# Patient Record
Sex: Male | Born: 1980 | Race: White | Hispanic: No | Marital: Married | Smoking: Current every day smoker
Health system: Southern US, Community
[De-identification: ages and names within clinical notes are randomized; demographics above are authoritative.]

## PROBLEM LIST (undated history)

## (undated) DIAGNOSIS — K429 Umbilical hernia without obstruction or gangrene: Secondary | ICD-10-CM

## (undated) DIAGNOSIS — I739 Peripheral vascular disease, unspecified: Secondary | ICD-10-CM

## (undated) HISTORY — DX: Peripheral vascular disease, unspecified: I73.9

---

## 2004-12-19 ENCOUNTER — Emergency Department (HOSPITAL_COMMUNITY): Admission: EM | Admit: 2004-12-19 | Discharge: 2004-12-19 | Payer: Self-pay | Admitting: *Deleted

## 2009-11-29 ENCOUNTER — Emergency Department: Payer: Self-pay | Admitting: Emergency Medicine

## 2010-06-02 IMAGING — CR LEFT RING FINGER 2+V
1 series · 3 of 3 positions shown · non-contrast
Comparison: none

REASON FOR EXAM: injury 3 months ago
COMMENTS:   May transport without cardiac monitor

PROCEDURE:     DXR - DXR FINGER RING 4TH DIGIT LT AZAM  - November 29, 2009 [DATE]
RESULT:     Three views were obtained. No fracture, dislocation or other
acute bony abnormality is identified.

[Series 1: view not recorded · 0.17mm/px · 3 of 3 slices shown]
[im 1/3]
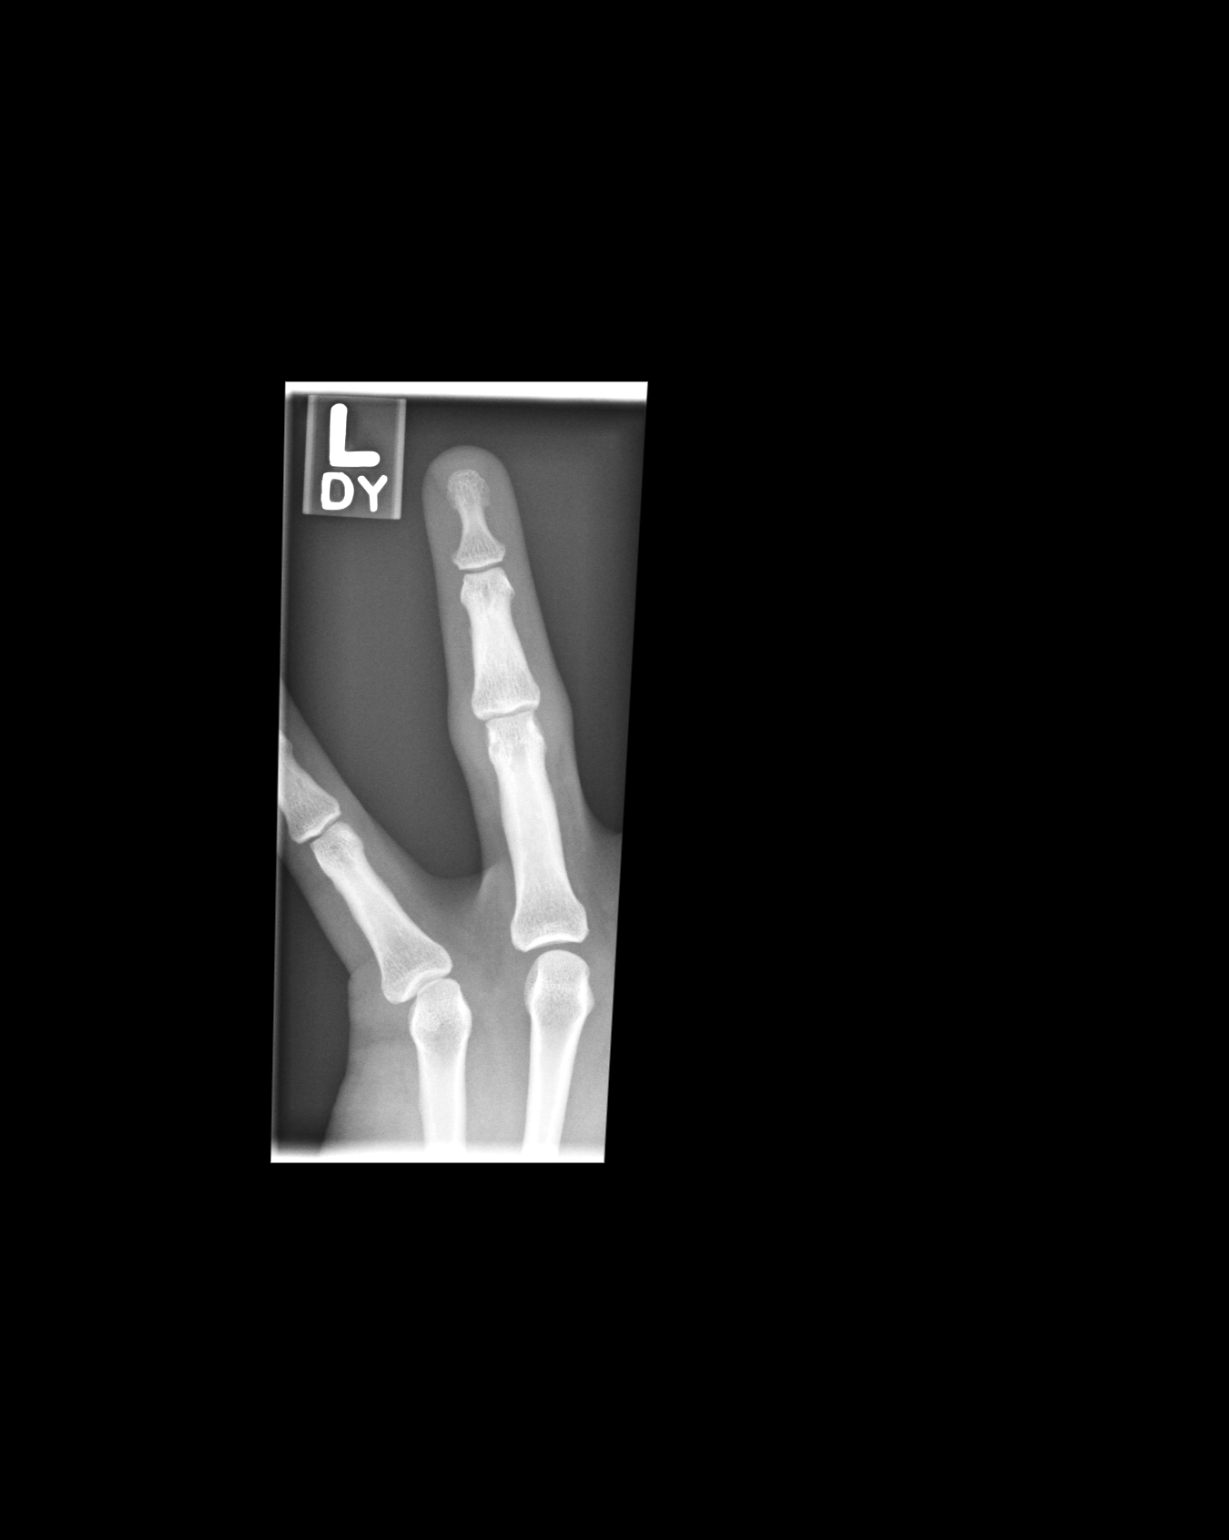
[im 2/3]
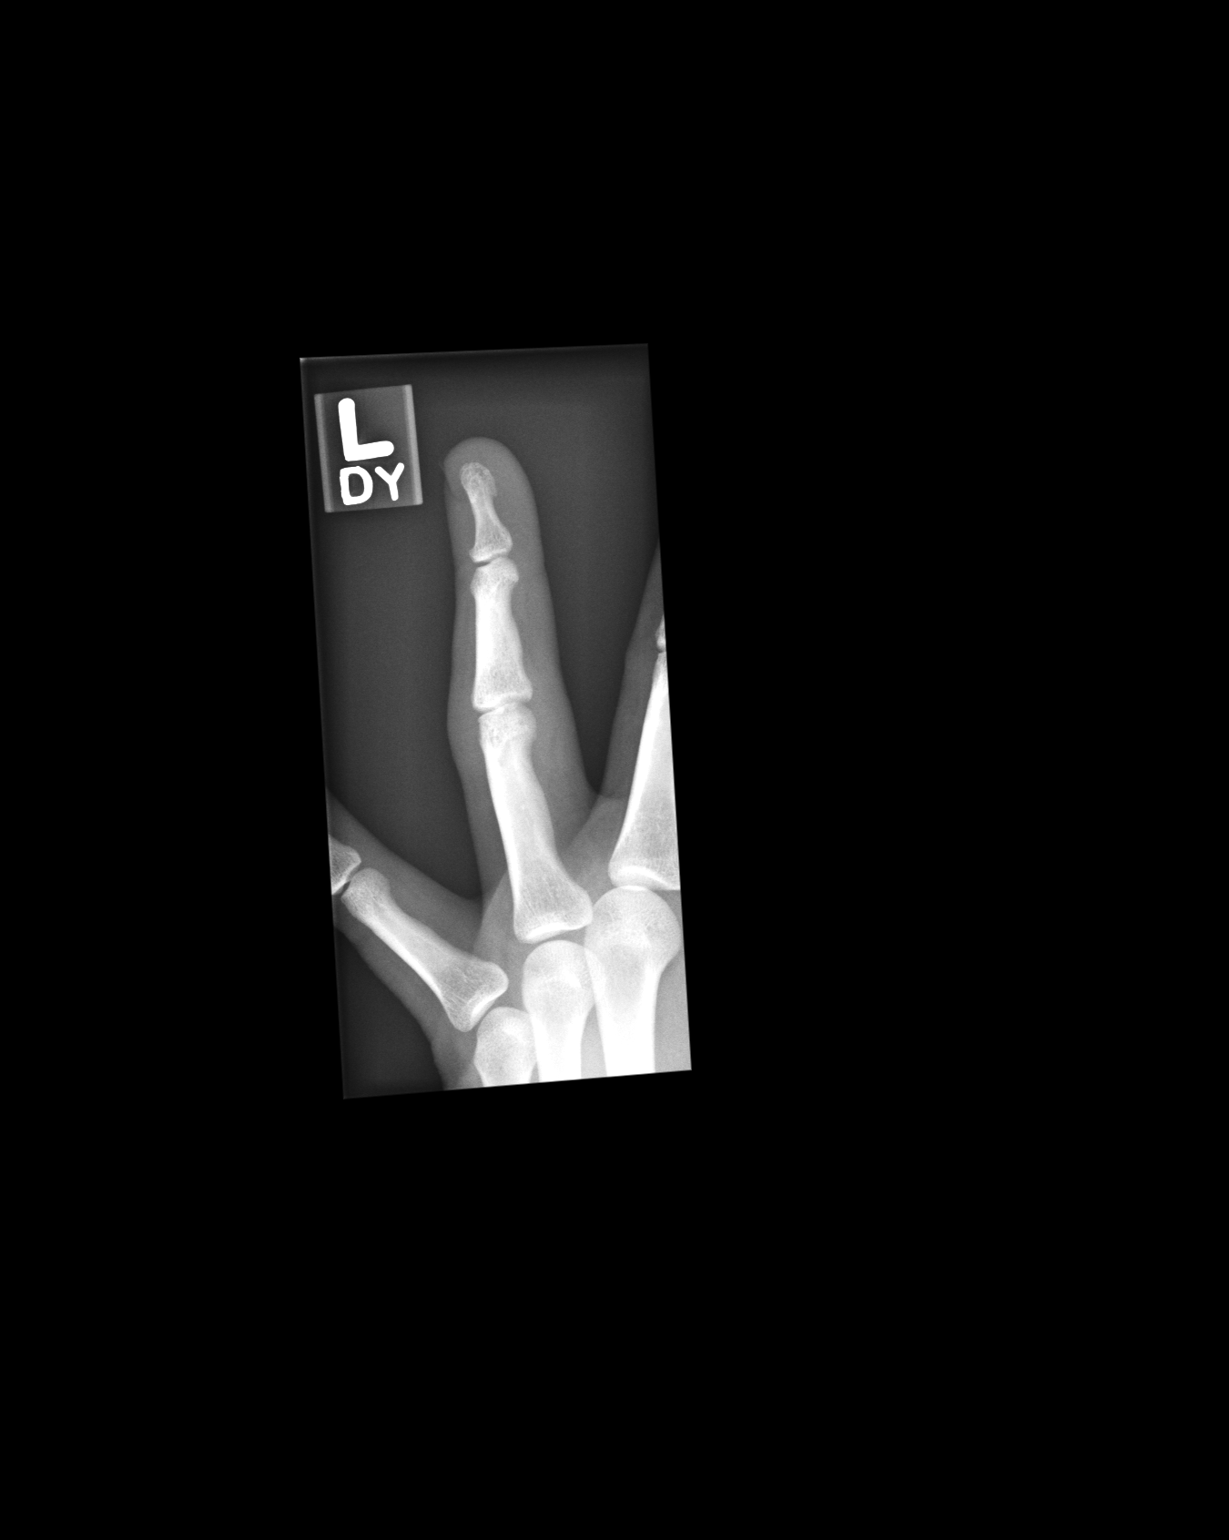
[im 3/3]
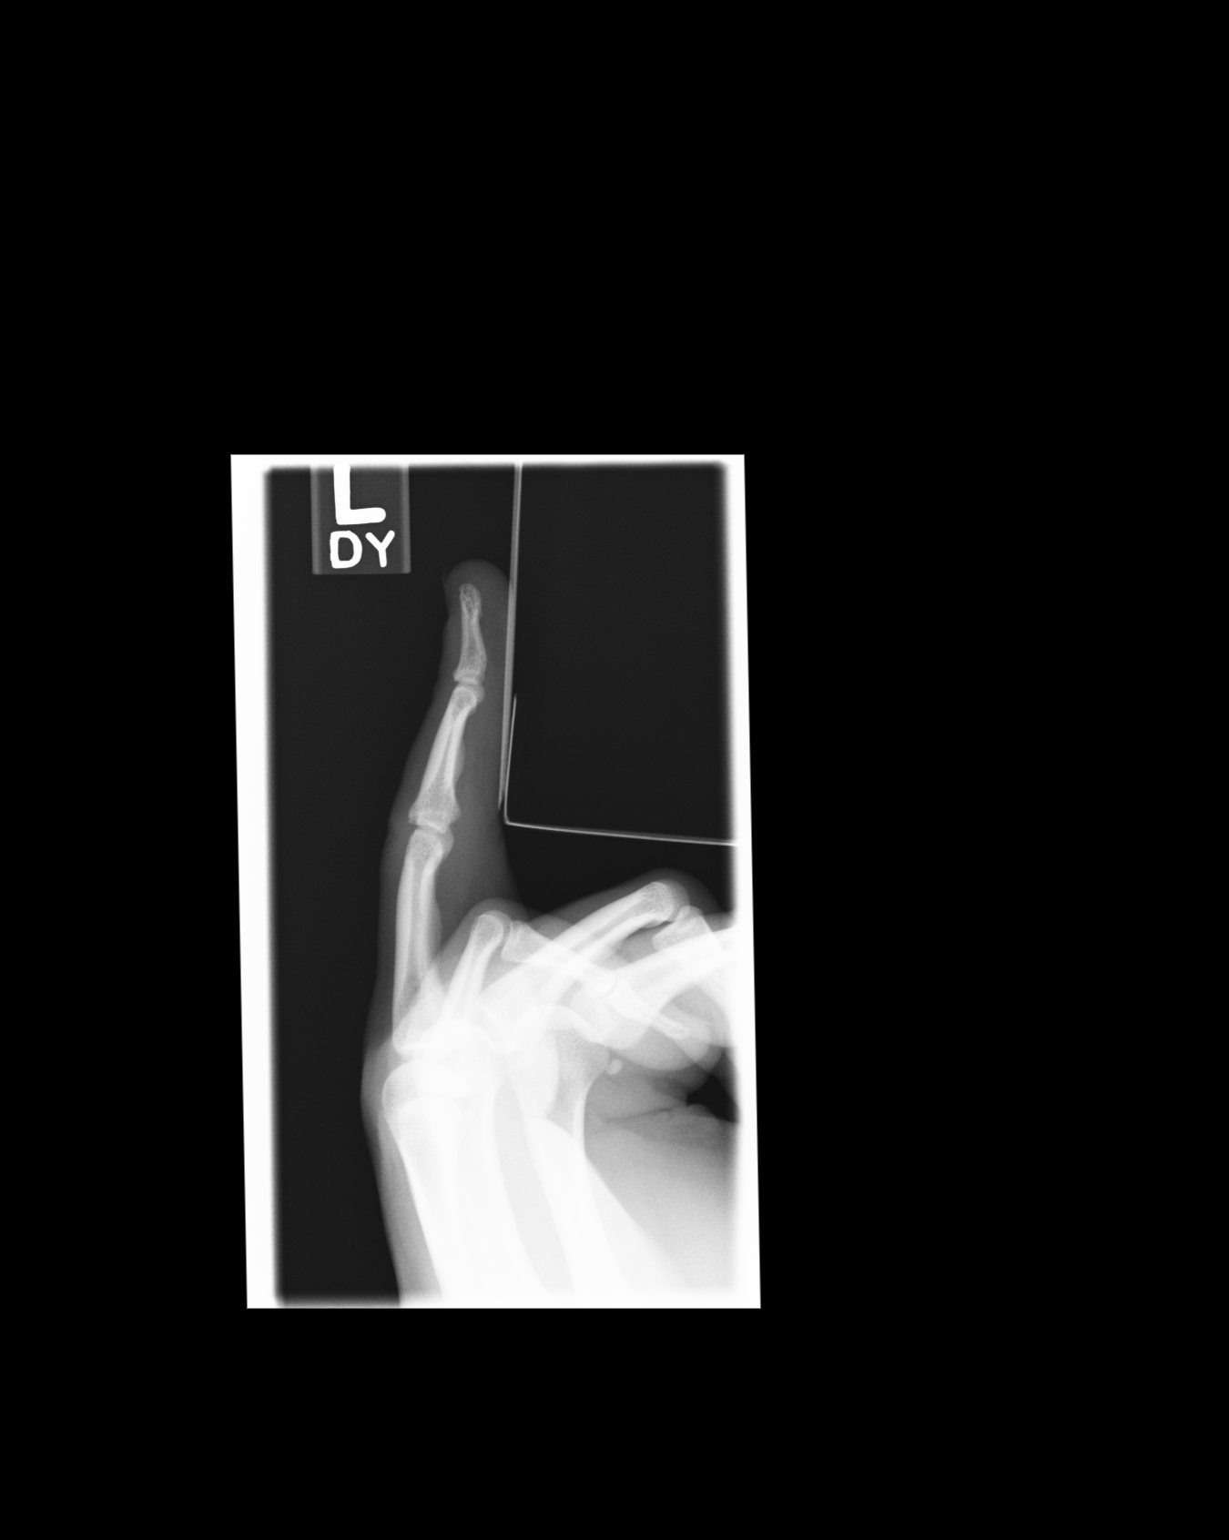

[3 of 3 positions shown; findings below may reference images not displayed]

IMPRESSION: 1. No significant osseous abnormalities are noted.
2. No radiodense soft tissue foreign body is seen.

## 2010-09-12 ENCOUNTER — Ambulatory Visit: Payer: Self-pay

## 2011-03-16 IMAGING — CR DG FOOT COMPLETE 3+V*L*
1 series · 3 of 3 positions shown · non-contrast
Comparison: none

REASON FOR EXAM: left foot pain from fall Please Fax result [phone]
COMMENTS:

PROCEDURE:     DXR - DXR FOOT LT COMP W/OBLIQUES  - September 12, 2010  [DATE]
RESULT:     Images of the left foot demonstrate no evidence of fracture,
dislocation or radiopaque foreign body.

[Series 1: view not recorded · 0.17mm/px · 3 of 3 slices shown]
[im 1/3]
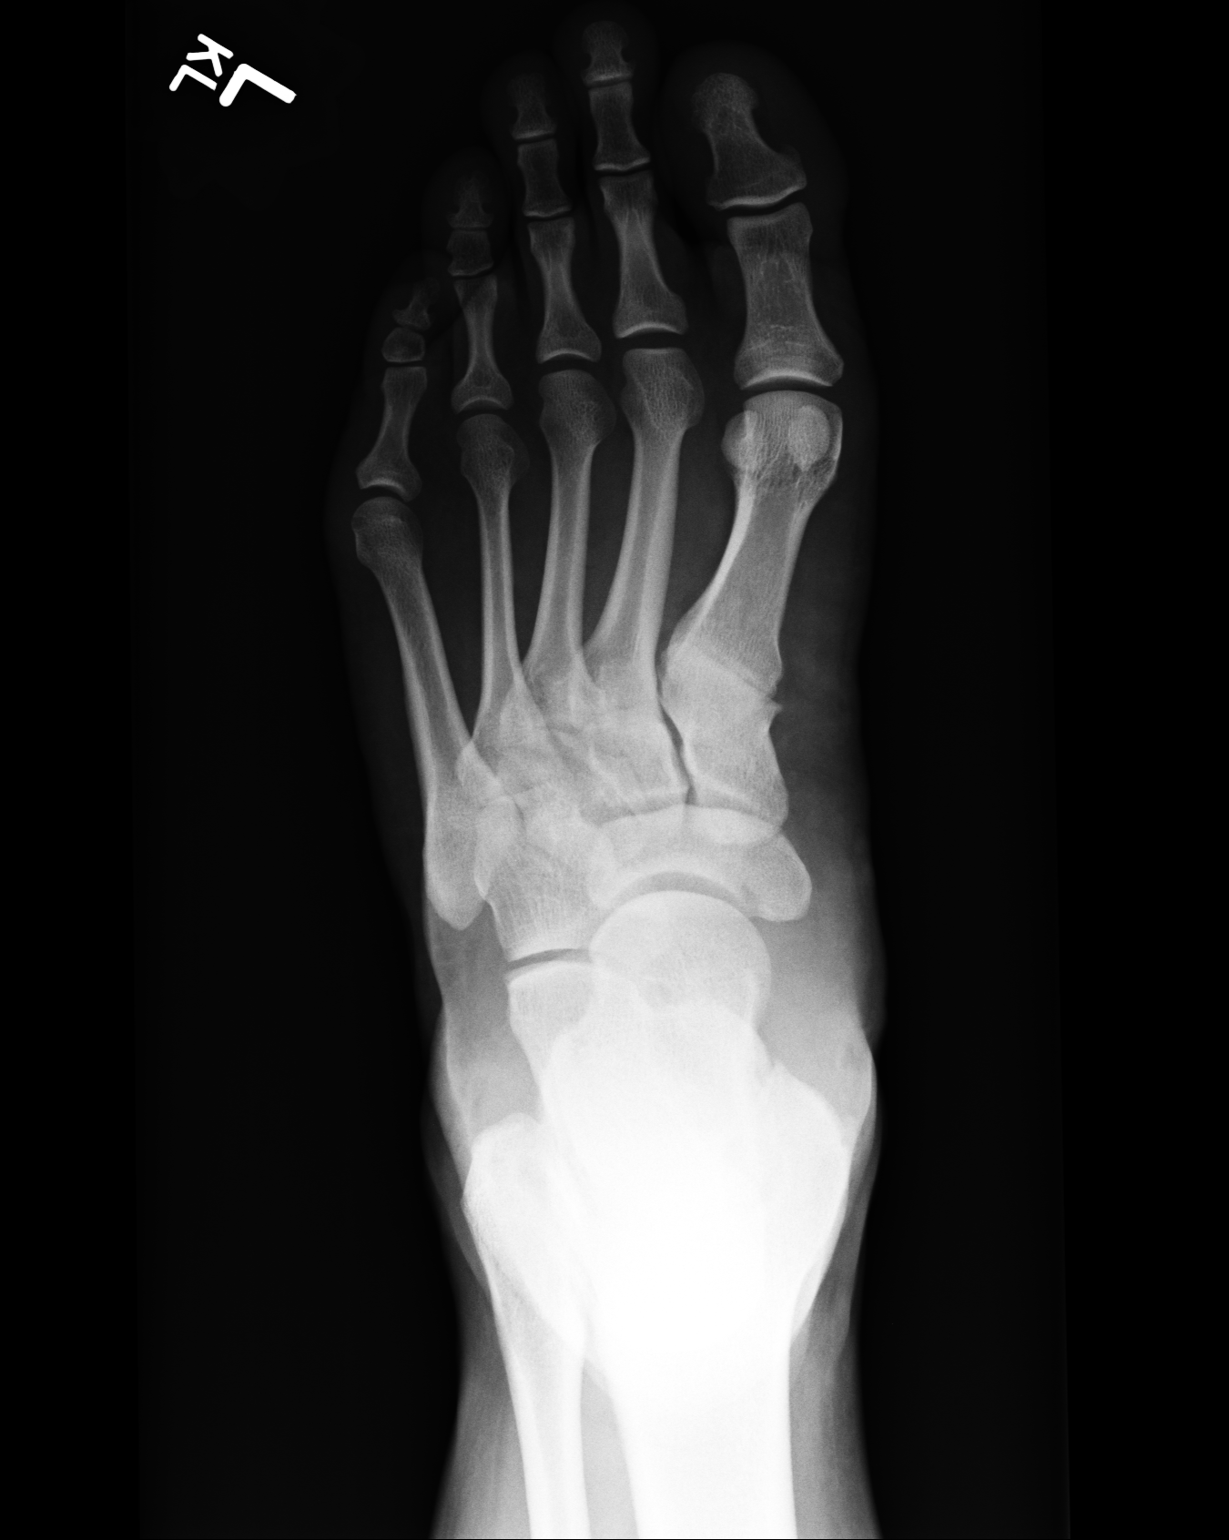
[im 2/3]
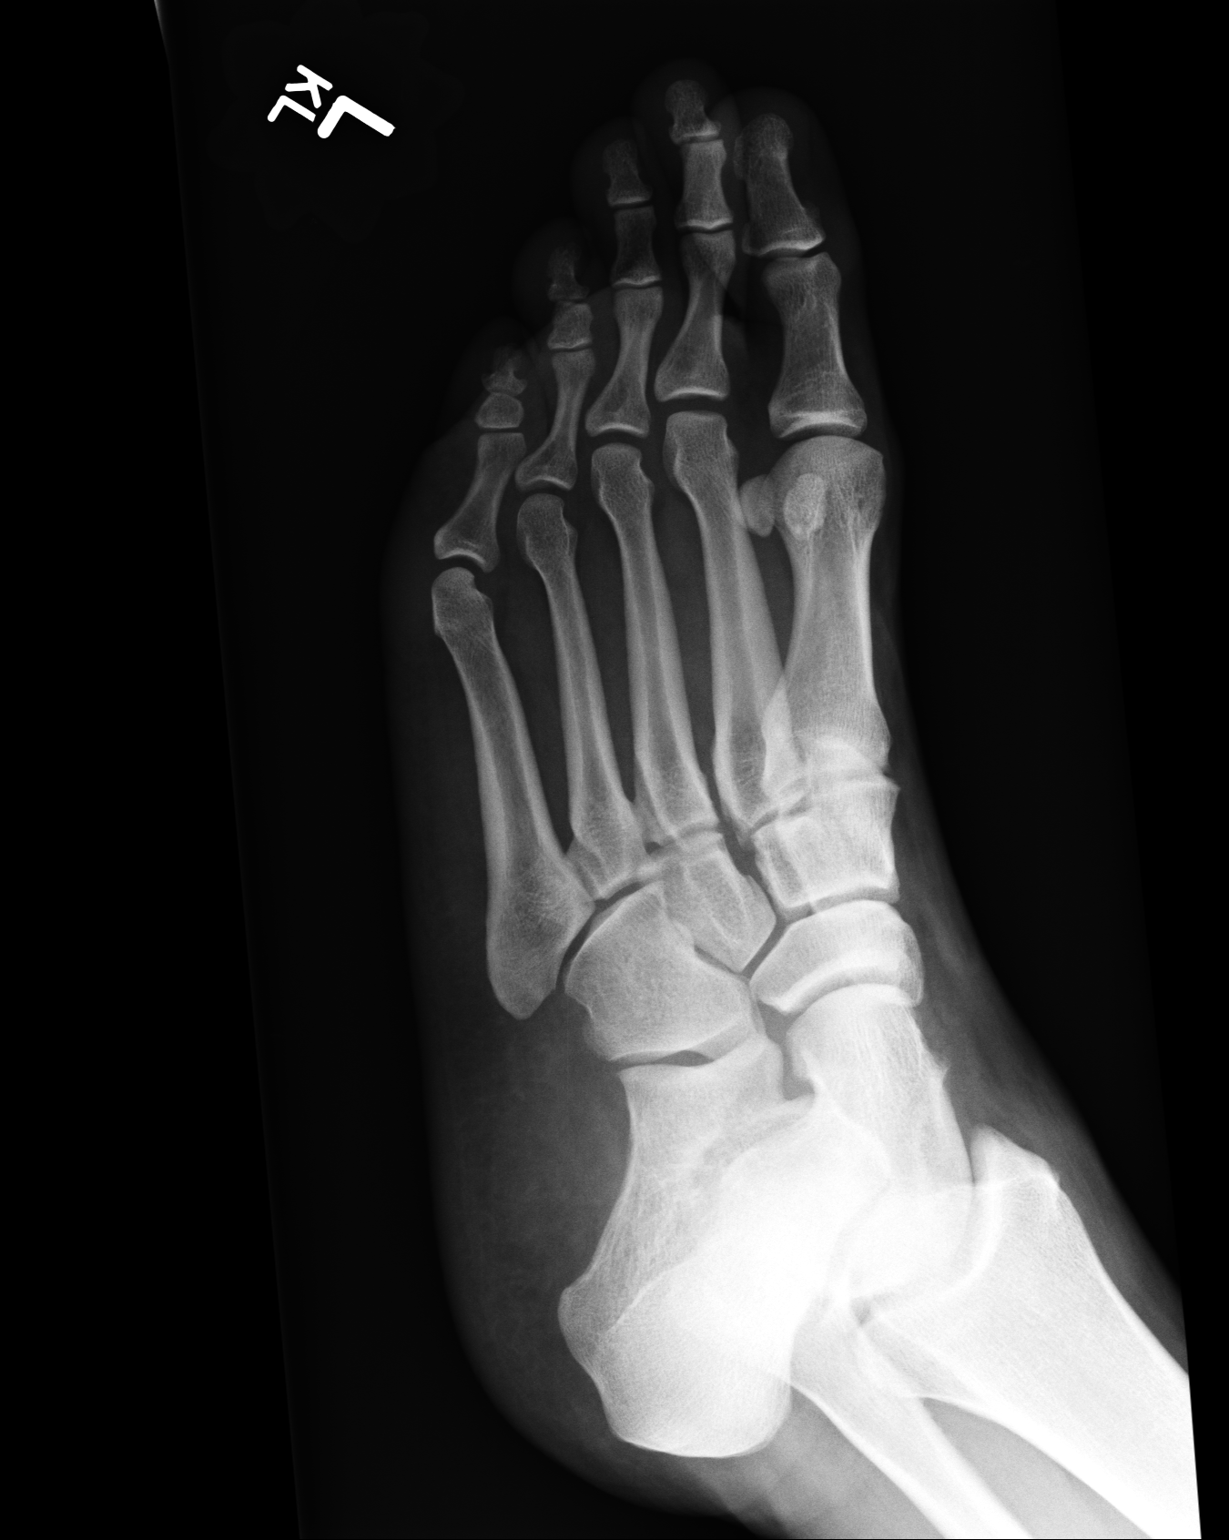
[im 3/3]
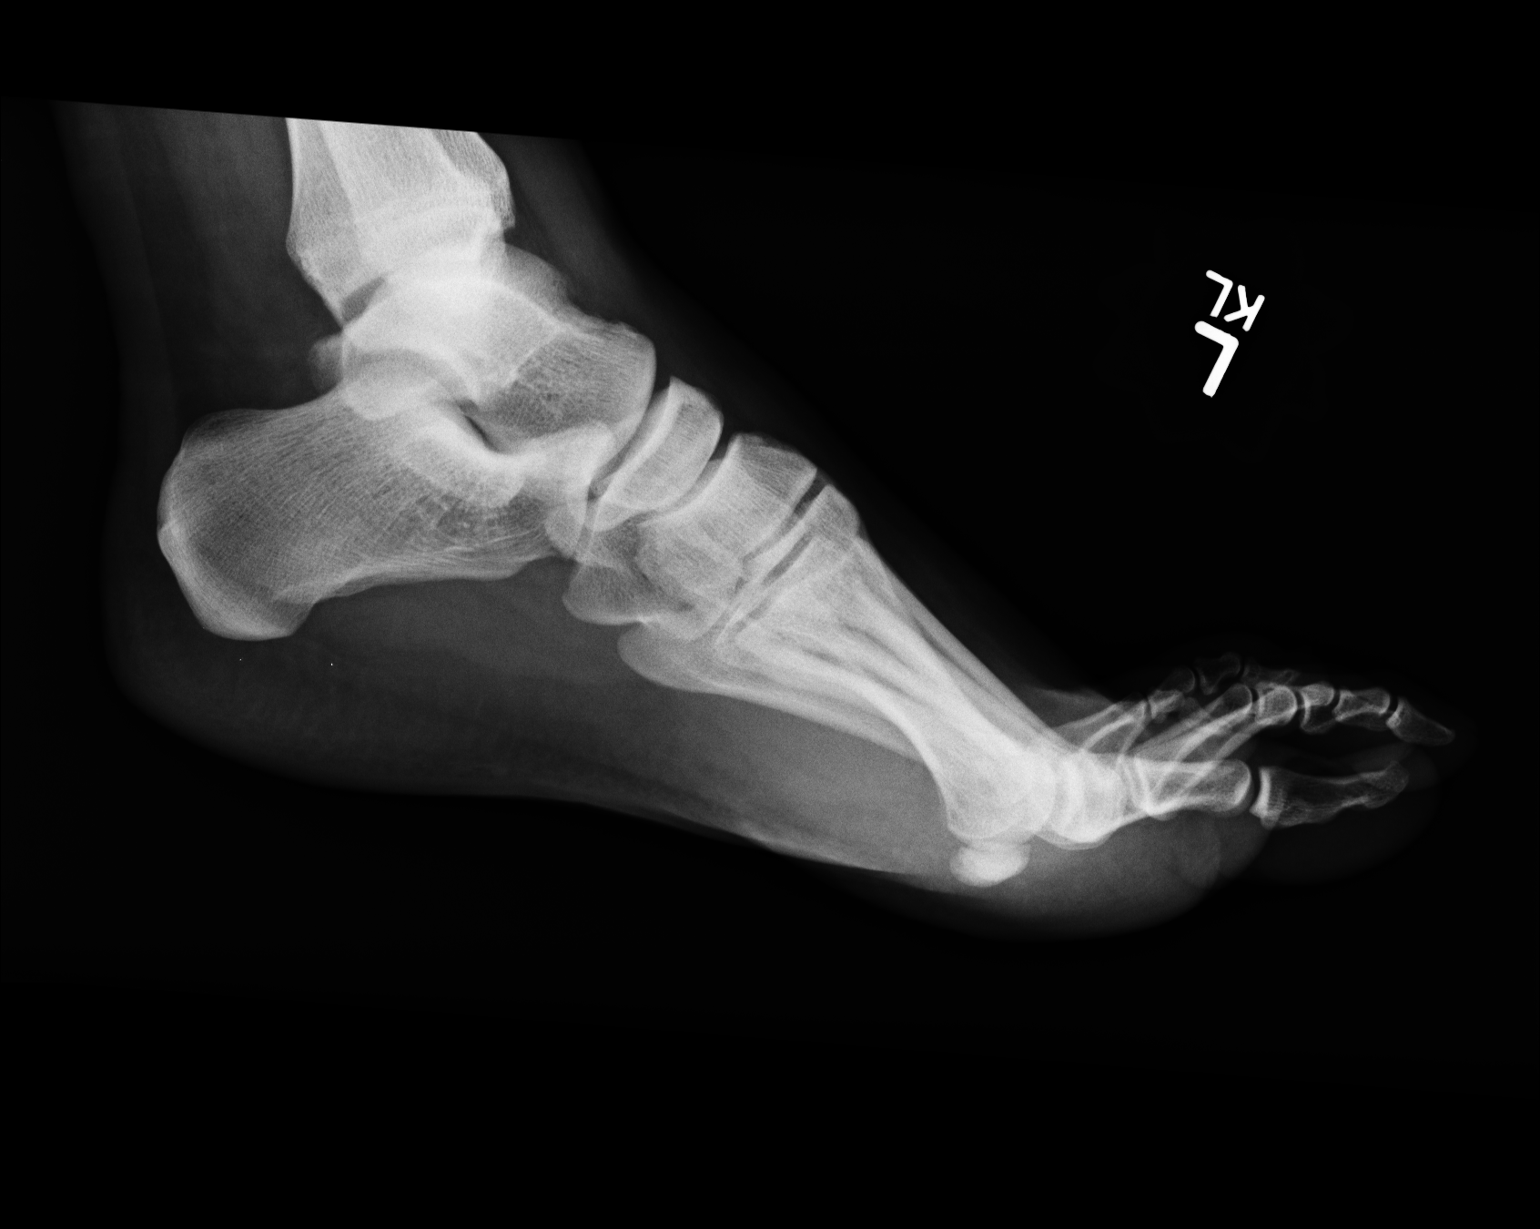

[3 of 3 positions shown; findings below may reference images not displayed]

IMPRESSION: No acute bony abnormality evident.

## 2022-05-01 ENCOUNTER — Emergency Department (HOSPITAL_COMMUNITY): Payer: Medicaid Other

## 2022-05-01 ENCOUNTER — Emergency Department (HOSPITAL_COMMUNITY)
Admission: EM | Admit: 2022-05-01 | Discharge: 2022-05-02 | Disposition: A | Discharge status: Home / Self Care | Payer: Medicaid Other | Attending: Emergency Medicine | Admitting: Emergency Medicine

## 2022-05-01 ENCOUNTER — Encounter (HOSPITAL_COMMUNITY): Payer: Self-pay | Admitting: Emergency Medicine

## 2022-05-01 DIAGNOSIS — T50901A Poisoning by unspecified drugs, medicaments and biological substances, accidental (unintentional), initial encounter: Secondary | ICD-10-CM

## 2022-05-01 DIAGNOSIS — D72829 Elevated white blood cell count, unspecified: Secondary | ICD-10-CM | POA: Diagnosis not present

## 2022-05-01 DIAGNOSIS — T424X1A Poisoning by benzodiazepines, accidental (unintentional), initial encounter: Secondary | ICD-10-CM | POA: Insufficient documentation

## 2022-05-01 DIAGNOSIS — T405X1A Poisoning by cocaine, accidental (unintentional), initial encounter: Secondary | ICD-10-CM | POA: Diagnosis not present

## 2022-05-01 DIAGNOSIS — R21 Rash and other nonspecific skin eruption: Secondary | ICD-10-CM | POA: Insufficient documentation

## 2022-05-01 DIAGNOSIS — R4182 Altered mental status, unspecified: Secondary | ICD-10-CM | POA: Diagnosis not present

## 2022-05-01 LAB — CBC WITH DIFFERENTIAL/PLATELET
Abs Immature Granulocytes: 0.19 10*3/uL — ABNORMAL HIGH (ref 0.00–0.07)
Basophils Absolute: 0.1 10*3/uL (ref 0.0–0.1)
Basophils Relative: 1 %
Eosinophils Absolute: 0.6 10*3/uL — ABNORMAL HIGH (ref 0.0–0.5)
Eosinophils Relative: 5 %
HCT: 38.7 % — ABNORMAL LOW (ref 39.0–52.0)
Hemoglobin: 12 g/dL — ABNORMAL LOW (ref 13.0–17.0)
Immature Granulocytes: 2 %
Lymphocytes Relative: 20 %
Lymphs Abs: 2.5 10*3/uL (ref 0.7–4.0)
MCH: 26 pg (ref 26.0–34.0)
MCHC: 31 g/dL (ref 30.0–36.0)
MCV: 83.9 fL (ref 80.0–100.0)
Monocytes Absolute: 1.1 10*3/uL — ABNORMAL HIGH (ref 0.1–1.0)
Monocytes Relative: 9 %
Neutro Abs: 7.6 10*3/uL (ref 1.7–7.7)
Neutrophils Relative %: 63 %
Platelets: 300 10*3/uL (ref 150–400)
RBC: 4.61 MIL/uL (ref 4.22–5.81)
RDW: 14.4 % (ref 11.5–15.5)
WBC: 12.1 10*3/uL — ABNORMAL HIGH (ref 4.0–10.5)
nRBC: 0 % (ref 0.0–0.2)

## 2022-05-01 LAB — URINALYSIS, ROUTINE W REFLEX MICROSCOPIC
Bilirubin Urine: NEGATIVE
Glucose, UA: NEGATIVE mg/dL
Hgb urine dipstick: NEGATIVE
Ketones, ur: NEGATIVE mg/dL
Leukocytes,Ua: NEGATIVE
Nitrite: NEGATIVE
Protein, ur: NEGATIVE mg/dL
Specific Gravity, Urine: 1.01 (ref 1.005–1.030)
pH: 6 (ref 5.0–8.0)

## 2022-05-01 LAB — COMPREHENSIVE METABOLIC PANEL
ALT: 11 U/L (ref 0–44)
AST: 16 U/L (ref 15–41)
Albumin: 3.6 g/dL (ref 3.5–5.0)
Alkaline Phosphatase: 77 U/L (ref 38–126)
Anion gap: 8 (ref 5–15)
BUN: 17 mg/dL (ref 6–20)
CO2: 29 mmol/L (ref 22–32)
Calcium: 8.5 mg/dL — ABNORMAL LOW (ref 8.9–10.3)
Chloride: 103 mmol/L (ref 98–111)
Creatinine, Ser: 0.8 mg/dL (ref 0.61–1.24)
GFR, Estimated: >60 mL/min (ref >60)
Glucose, Bld: 97 mg/dL (ref 70–99)
Potassium: 4.1 mmol/L (ref 3.5–5.1)
Sodium: 140 mmol/L (ref 135–145)
Total Bilirubin: 0.4 mg/dL (ref 0.3–1.2)
Total Protein: 6.8 g/dL (ref 6.5–8.1)

## 2022-05-01 LAB — RAPID URINE DRUG SCREEN, HOSP PERFORMED
Amphetamines: NOT DETECTED
Barbiturates: NOT DETECTED
Benzodiazepines: POSITIVE — AB
Cocaine: POSITIVE — AB
Opiates: NOT DETECTED
Tetrahydrocannabinol: NOT DETECTED

## 2022-05-01 LAB — ETHANOL: Alcohol, Ethyl (B): <10 mg/dL (ref <10)

## 2022-05-01 MED ORDER — IOHEXOL 300 MG/ML  SOLN
75.0000 mL | Freq: Once | INTRAMUSCULAR | Status: AC | PRN
  Administered 2022-05-01: 75 mL via INTRAVENOUS

## 2022-05-01 MED ORDER — SODIUM CHLORIDE 0.9 % IV BOLUS
1000.0000 mL | Freq: Once | INTRAVENOUS | Status: AC
  Administered 2022-05-01: 1000 mL via INTRAVENOUS

## 2022-05-01 MED ORDER — ALBUTEROL SULFATE (2.5 MG/3ML) 0.083% IN NEBU
2.5000 mg | INHALATION_SOLUTION | Freq: Once | RESPIRATORY_TRACT | Status: AC
  Administered 2022-05-02: 2.5 mg via RESPIRATORY_TRACT
  Filled 2022-05-01: qty 3

## 2022-05-01 MED ORDER — ONDANSETRON HCL 4 MG/2ML IJ SOLN
4.0000 mg | Freq: Once | INTRAMUSCULAR | Status: AC
  Administered 2022-05-01: 4 mg via INTRAVENOUS
  Filled 2022-05-01: qty 2

## 2022-05-01 MED ORDER — NALOXONE HCL 2 MG/2ML IJ SOSY
2.0000 mg | PREFILLED_SYRINGE | INTRAMUSCULAR | Status: DC | PRN
  Administered 2022-05-01: 2 mg via INTRAVENOUS
  Filled 2022-05-01: qty 2

## 2022-05-01 MED ORDER — METHYLPREDNISOLONE SODIUM SUCC 125 MG IJ SOLR
125.0000 mg | Freq: Once | INTRAMUSCULAR | Status: AC
  Administered 2022-05-01: 125 mg via INTRAVENOUS
  Filled 2022-05-01: qty 2

## 2022-05-01 MED ORDER — IPRATROPIUM-ALBUTEROL 0.5-2.5 (3) MG/3ML IN SOLN
3.0000 mL | Freq: Once | RESPIRATORY_TRACT | Status: AC
  Administered 2022-05-02: 3 mL via RESPIRATORY_TRACT
  Filled 2022-05-01: qty 3

## 2022-05-01 NOTE — ED Provider Notes (Signed)
11:32 PM Assumed care from Dr. Estell Harpin, please see their note for full history, physical and decision making until this point. In brief this is a 41 y.o. year old male who presented to the ED tonight with Drug Overdose     Here with OD but slightly hypoxic with wheezing and new mild hypoxia. CXR shows large right sided opacity, pending repeat CT/nebs and reeval for disposition.   Patient observed for multiple hours and every once while would become hypoxic but only when he went to sleep.  He would become normoxic on his own without any intervention.  He was put on oxygen and just to keep his numbers looking good.  When he was awake he never dropped below 92 during a prolonged conversation.  I discussed this with his wife as well and it sounds he does have symptoms consistent with likely sleep apnea.  He wakes up multiple times at night gasping for air.  He has persistent hypertension.  Does not have a PCP so I will refer him to pulmonology for sleep study.  Prescription for Narcan given.  He is protecting his airway he has respiratory drive he is stable for discharge with his family.  Discharge instructions, including strict return precautions for new or worsening symptoms, given. Patient and/or family verbalized understanding and agreement with the plan as described.   Labs, studies and imaging reviewed by myself and considered in medical decision making if ordered. Imaging interpreted by radiology.  Labs Reviewed  CBC WITH DIFFERENTIAL/PLATELET - Abnormal; Notable for the following components:      Result Value   WBC 12.1 (*)    Hemoglobin 12.0 (*)    HCT 38.7 (*)    Monocytes Absolute 1.1 (*)    Eosinophils Absolute 0.6 (*)    Abs Immature Granulocytes 0.19 (*)    All other components within normal limits  COMPREHENSIVE METABOLIC PANEL - Abnormal; Notable for the following components:   Calcium 8.5 (*)    All other components within normal limits  RAPID URINE DRUG SCREEN, HOSP PERFORMED -  Abnormal; Notable for the following components:   Cocaine POSITIVE (*)    Benzodiazepines POSITIVE (*)    All other components within normal limits  URINALYSIS, ROUTINE W REFLEX MICROSCOPIC - Abnormal; Notable for the following components:   Color, Urine STRAW (*)    All other components within normal limits  ETHANOL    DG Chest Port 1 View  Final Result    CT Head Wo Contrast  Final Result    CT Chest W Contrast    (Results Pending)    No follow-ups on file.    Konica Stankowski, Barbara Cower, MD 05/02/22 [phone]

## 2022-05-01 NOTE — ED Notes (Signed)
Transported to CT 

## 2022-05-01 NOTE — ED Notes (Addendum)
Patient awake after narcan administration. Coughing and moving in the bed. Dr. Estell Harpin to bedside.

## 2022-05-01 NOTE — ED Notes (Addendum)
Pt arrived via EMS, per EMS pt spouse states pt has been lethargic since this morning. Smoked some marijuana but no other drugs. Given narcan by spouse with no effect. Pt wakes to tactile stimuli. On 2L [place].

## 2022-05-01 NOTE — ED Provider Notes (Signed)
[facility]-EMERGENCY DEPT Provider Note   CSN: [id number] Arrival date & time: 05/01/22  1700     History {Add pertinent medical, surgical, social history, OB history to HPI:1} Chief Complaint  Patient presents with   Drug Overdose    TOWNSEND CUDWORTH is a 41 y.o. male.  Patient with brought in for possible substance abuse and altered mental status.  His wife gave him some Narcan sporadically without help..  Patient has a history of ulcers on his lower legs.  The history is provided by a relative and the spouse. No language interpreter was used.  Drug Overdose This is a new problem. The current episode started less than 1 hour ago. The problem occurs rarely. Nothing aggravates the symptoms. He has tried nothing for the symptoms. The treatment provided no relief.       Home Medications Prior to Admission medications   Medication Sig Start Date End Date Taking? Authorizing Provider  acetaminophen (TYLENOL) 500 MG tablet Take 500-1,000 mg by mouth every 6 (six) hours as needed for mild pain or headache.   Yes [provider]      Allergies    Patient has no known allergies.    Review of Systems   Review of Systems  Unable to perform ROS: Mental status change    Physical Exam Updated Vital Signs BP (!) 175/95   Pulse 89   Temp 99.3 F (37.4 C)   Resp (!) 31   SpO2 95%  Physical Exam Vitals and nursing note reviewed.  Constitutional:      Appearance: He is well-developed.     Comments: Patient responding to painful stimuli only  HENT:     Head: Normocephalic.     Nose: Nose normal.  Eyes:     General: No scleral icterus.    Conjunctiva/sclera: Conjunctivae normal.     Comments: Pinpoint pupils  Neck:     Thyroid: No thyromegaly.  Cardiovascular:     Rate and Rhythm: Normal rate and regular rhythm.     Heart sounds: No murmur heard.    No friction rub. No gallop.  Pulmonary:     Breath sounds: No stridor. No rales.  Chest:      Chest wall: No tenderness.  Abdominal:     General: There is no distension.     Tenderness: There is no abdominal tenderness. There is no rebound.  Musculoskeletal:        General: Normal range of motion.     Cervical back: Neck supple.  Lymphadenopathy:     Cervical: No cervical adenopathy.  Skin:    Findings: Rash present. No erythema.     Comments: Ulcers on both lower legs  Neurological:     Mental Status: He is oriented to person, place, and time.     Motor: No abnormal muscle tone.     Coordination: Coordination normal.     Comments: Lethargic  Psychiatric:        Behavior: Behavior normal.     ED Results / Procedures / Treatments   Labs (all labs ordered are listed, but only abnormal results are displayed) Labs Reviewed  CBC WITH DIFFERENTIAL/PLATELET - Abnormal; Notable for the following components:      Result Value   WBC 12.1 (*)    Hemoglobin 12.0 (*)    HCT 38.7 (*)    Monocytes Absolute 1.1 (*)    Eosinophils Absolute 0.6 (*)    Abs Immature Granulocytes 0.19 (*)  All other components within normal limits  COMPREHENSIVE METABOLIC PANEL - Abnormal; Notable for the following components:   Calcium 8.5 (*)    All other components within normal limits  RAPID URINE DRUG SCREEN, HOSP PERFORMED - Abnormal; Notable for the following components:   Cocaine POSITIVE (*)    Benzodiazepines POSITIVE (*)    All other components within normal limits  URINALYSIS, ROUTINE W REFLEX MICROSCOPIC - Abnormal; Notable for the following components:   Color, Urine STRAW (*)    All other components within normal limits  ETHANOL    EKG EKG Interpretation  Date/Time:  Monday May 01 2022 17:31:24 EDT Ventricular Rate:  78 PR Interval:  144 QRS Duration: 89 QT Interval:  396 QTC Calculation: 452 R Axis:   25 Text Interpretation: Sinus rhythm Confirmed by Bethann Berkshire [phone]) on 05/01/2022 8:22:14 PM  Radiology DG Chest Port 1 View  Result Date: 05/01/2022 CLINICAL  DATA:  Shortness of breath EXAM: PORTABLE CHEST 1 VIEW COMPARISON:  None. FINDINGS: There is increased soft tissue density in the low right paratracheal region and right suprahilar region measuring 7.8 by 1.8 cm. The cardiac silhouette is within normal limits. The lungs are otherwise clear. No pleural effusion or pneumothorax. No acute fractures are seen. IMPRESSION: Abnormal soft tissue density in the right paratracheal and suprahilar region, indeterminate. Recommend further evaluation with chest CT. Electronically Signed   By: Darliss Cheney M.D.   On: 05/01/2022 23:26   CT Head Wo Contrast  Result Date: 05/01/2022 CLINICAL DATA:  Mental status change. EXAM: CT HEAD WITHOUT CONTRAST TECHNIQUE: Contiguous axial images were obtained from the base of the skull through the vertex without intravenous contrast. RADIATION DOSE REDUCTION: This exam was performed according to the departmental dose-optimization program which includes automated exposure control, adjustment of the mA and/or kV according to patient size and/or use of iterative reconstruction technique. COMPARISON:  None Available. FINDINGS: Brain: No evidence of acute infarction, hemorrhage, hydrocephalus, extra-axial collection or mass lesion/mass effect. Vascular: No hyperdense vessel or unexpected calcification. Skull: Normal. Negative for fracture or focal lesion. Sinuses/Orbits: Mucosal thickening in scattered ethmoid air cells and the maxillary sinuses. Other: None. IMPRESSION: No acute intracranial abnormalities identified. Electronically Signed   By: Gerome Sam III M.D.   On: 05/01/2022 18:55    Procedures Procedures  {Document cardiac monitor, telemetry assessment procedure when appropriate:1}  Medications Ordered in ED Medications  naloxone [facility]) injection 2 mg (2 mg Intravenous Given 05/01/22 2003)  ipratropium-albuterol (DUONEB) 0.5-2.5 (3) MG/3ML nebulizer solution 3 mL (has no administration in time range)  albuterol  (PROVENTIL) (2.5 MG/3ML) 0.083% nebulizer solution 2.5 mg (has no administration in time range)  sodium chloride 0.9 % bolus 1,000 mL (0 mLs Intravenous Stopped 05/01/22 1937)  ondansetron (ZOFRAN) injection 4 mg (4 mg Intravenous Given 05/01/22 2014)  methylPREDNISolone sodium succinate (SOLU-MEDROL) 125 mg/2 mL injection 125 mg (125 mg Intravenous Given 05/01/22 2301)    ED Course/ Medical Decision Making/ A&P  CRITICAL CARE Performed by: Bethann Berkshire Total critical care time: 40 minutes Critical care time was exclusive of separately billable procedures and treating other patients. Critical care was necessary to treat or prevent imminent or life-threatening deterioration. Critical care was time spent personally by me on the following activities: development of treatment plan with patient and/or surrogate as well as nursing, discussions with consultants, evaluation of patient's response to treatment, examination of patient, obtaining history from patient or surrogate, ordering and performing treatments and interventions, ordering and review of laboratory studies,  ordering and review of radiographic studies, pulse oximetry and re-evaluation of patient's condition.    Patient with substance abuse.  He was given Narcan by his wife without help.  After the patient was in the emergency department for a number hours she was given some IV Narcan and he did awake.  Patient was very agitated and had some wheezing.  He had mild decrease sats was noted to sat around 86 to 87% on room air.  Patient was given neb treatments and Solu-Medrol and chest x-ray.  Chest x-ray showed questionable mass so he is getting a CT now                         Medical Decision Making Amount and/or Complexity of Data Reviewed Labs: ordered. Radiology: ordered. ECG/medicine tests: ordered.  Risk Prescription drug management.   Patient with substance abuse and altered mental status which has improved with Narcan.   Disposition will be determined by Dr. Clayborne Dana  {Document critical care time when appropriate:1} {Document review of labs and clinical decision tools ie heart score, Chads2Vasc2 etc:1}  {Document your independent review of radiology images, and any outside records:1} {Document your discussion with family members, caretakers, and with consultants:1} {Document social determinants of health affecting pt's care:1} {Document your decision making why or why not admission, treatments were needed:1} Final Clinical Impression(s) / ED Diagnoses Final diagnoses:  None    Rx / DC Orders ED Discharge Orders     None

## 2022-05-01 NOTE — ED Notes (Signed)
Patient alert and provided with urinal as requested.

## 2022-05-01 NOTE — ED Notes (Signed)
Patient remains alert to voice, agitated, and moving in the bed.

## 2022-05-02 MED ORDER — CEPHALEXIN 500 MG PO CAPS: 500.0000 mg | ORAL_CAPSULE | Freq: Four times a day (QID) | ORAL | 0 refills | Status: DC

## 2022-05-02 MED ORDER — NALOXONE HCL 4 MG/0.1ML NA LIQD: NASAL | 1 refills | Status: AC

## 2022-05-02 NOTE — ED Notes (Signed)
Pt tolerated 2 saltines and half a coke per his visitor.

## 2022-05-02 NOTE — ED Notes (Signed)
Pt provided saltines and coke for PO challenge.

## 2022-06-09 ENCOUNTER — Emergency Department (HOSPITAL_COMMUNITY)
Admission: EM | Admit: 2022-06-09 | Discharge: 2022-06-09 | Discharge status: Against Medical Advice | Payer: Medicaid Other

## 2022-06-09 NOTE — ED Notes (Signed)
Patient called x3 for triage without answer. 

## 2022-07-08 ENCOUNTER — Other Ambulatory Visit: Payer: Self-pay

## 2022-07-08 ENCOUNTER — Emergency Department (HOSPITAL_COMMUNITY)
Admission: EM | Admit: 2022-07-08 | Discharge: 2022-07-08 | Disposition: A | Discharge status: Home / Self Care | Payer: No Typology Code available for payment source | Attending: Emergency Medicine | Admitting: Emergency Medicine

## 2022-07-08 ENCOUNTER — Encounter (HOSPITAL_COMMUNITY): Payer: Self-pay

## 2022-07-08 DIAGNOSIS — K429 Umbilical hernia without obstruction or gangrene: Secondary | ICD-10-CM | POA: Diagnosis not present

## 2022-07-08 DIAGNOSIS — F17210 Nicotine dependence, cigarettes, uncomplicated: Secondary | ICD-10-CM | POA: Insufficient documentation

## 2022-07-08 DIAGNOSIS — R109 Unspecified abdominal pain: Secondary | ICD-10-CM | POA: Diagnosis present

## 2022-07-08 HISTORY — DX: Umbilical hernia without obstruction or gangrene: K42.9

## 2022-07-08 LAB — COMPREHENSIVE METABOLIC PANEL
ALT: 10 U/L (ref 0–44)
AST: 13 U/L — ABNORMAL LOW (ref 15–41)
Albumin: 3.6 g/dL (ref 3.5–5.0)
Alkaline Phosphatase: 68 U/L (ref 38–126)
Anion gap: 10 (ref 5–15)
BUN: 10 mg/dL (ref 6–20)
CO2: 29 mmol/L (ref 22–32)
Calcium: 9.1 mg/dL (ref 8.9–10.3)
Chloride: 103 mmol/L (ref 98–111)
Creatinine, Ser: 0.76 mg/dL (ref 0.61–1.24)
GFR, Estimated: >60 mL/min (ref >60)
Glucose, Bld: 75 mg/dL (ref 70–99)
Potassium: 4.2 mmol/L (ref 3.5–5.1)
Sodium: 142 mmol/L (ref 135–145)
Total Bilirubin: 0.3 mg/dL (ref 0.3–1.2)
Total Protein: 6.2 g/dL — ABNORMAL LOW (ref 6.5–8.1)

## 2022-07-08 LAB — URINALYSIS, ROUTINE W REFLEX MICROSCOPIC
Bilirubin Urine: NEGATIVE
Glucose, UA: NEGATIVE mg/dL
Hgb urine dipstick: NEGATIVE
Ketones, ur: NEGATIVE mg/dL
Leukocytes,Ua: NEGATIVE
Nitrite: NEGATIVE
Protein, ur: NEGATIVE mg/dL
Specific Gravity, Urine: 1.02 (ref 1.005–1.030)
pH: 6 (ref 5.0–8.0)

## 2022-07-08 LAB — CBC
HCT: 41.8 % (ref 39.0–52.0)
Hemoglobin: 12.9 g/dL — ABNORMAL LOW (ref 13.0–17.0)
MCH: 26.5 pg (ref 26.0–34.0)
MCHC: 30.9 g/dL (ref 30.0–36.0)
MCV: 85.8 fL (ref 80.0–100.0)
Platelets: 320 10*3/uL (ref 150–400)
RBC: 4.87 MIL/uL (ref 4.22–5.81)
RDW: 14.9 % (ref 11.5–15.5)
WBC: 9.1 10*3/uL (ref 4.0–10.5)
nRBC: 0 % (ref 0.0–0.2)

## 2022-07-08 LAB — LIPASE, BLOOD: Lipase: 27 U/L (ref 11–51)

## 2022-07-08 NOTE — ED Triage Notes (Signed)
Pt arrived POV from home c/o abdominal pain x1 year. Pt states he does have an umbilical hernia.

## 2022-07-08 NOTE — ED Provider Notes (Signed)
[facility] EMERGENCY DEPARTMENT Provider Note  CSN: [id number] Arrival date & time: 07/08/22 0725  Chief Complaint(s) Abdominal Pain  HPI Ian Cox is a 41 y.o. male without significant past medical history presenting to the emergency department for umbilical hernia.  Patient reports that he has had hernia for 1 year.  He reports the pain is intermittent at the site of the hernia.  He reports occasionally has nausea, vomiting, none recently.  He is having normal bowel movements.  No fevers or chills.  He is able to tolerate food without difficulty.  Symptoms are mild, intermittent.  He came to the emergency department because he wants a referral to surgeon.  He has not seen any physician for this hernia yet   Past Medical History Past Medical History:  Diagnosis Date   Umbilical hernia    There are no problems to display for this patient.  Home Medication(s) Prior to Admission medications   Medication Sig Start Date End Date Taking? Authorizing Provider  acetaminophen (TYLENOL) 500 MG tablet Take 500-1,000 mg by mouth every 6 (six) hours as needed for mild pain or headache.    [provider]  cephALEXin (KEFLEX) 500 MG capsule Take 1 capsule (500 mg total) by mouth 4 (four) times daily. 05/02/22   Mesner, Barbara Cower, MD  naloxone [facility]) nasal spray 4 mg/0.1 mL Use per directions on box for unconsciousness, stopping breathing after drug use. 05/02/22   Mesner, Barbara Cower, MD                                                                                                                                    Past Surgical History History reviewed. No pertinent surgical history. Family History History reviewed. No pertinent family history.  Social History Social History   Tobacco Use   Smoking status: Every Day    Types: Cigarettes   Smokeless tobacco: Never   Allergies Patient has no known allergies.  Review of Systems Review of Systems  All other  systems reviewed and are negative.   Physical Exam Vital Signs  I have reviewed the triage vital signs BP (!) 150/82   Pulse 65   Temp 98.1 F (36.7 C) (Oral)   Resp 18   Ht 5\' 11"  (1.803 m)   Wt 108.9 kg   SpO2 100%   BMI 33.47 kg/m  Physical Exam Vitals and nursing note reviewed.  Constitutional:      General: He is not in acute distress.    Appearance: Normal appearance.  HENT:     Head: Normocephalic and atraumatic.     Mouth/Throat:     Mouth: Mucous membranes are moist.  Eyes:     Conjunctiva/sclera: Conjunctivae normal.  Cardiovascular:     Rate and Rhythm: Normal rate.  Pulmonary:     Effort: Pulmonary effort is normal. No respiratory distress.  Abdominal:     General: Abdomen is flat.  Palpations: Abdomen is soft.     Tenderness: There is no abdominal tenderness.     Hernia: A hernia is present. Hernia is present in the umbilical area (soft, easily reducible).  Skin:    General: Skin is warm and dry.     Capillary Refill: Capillary refill takes less than 2 seconds.  Neurological:     General: No focal deficit present.     Mental Status: He is alert. Mental status is at baseline.  Psychiatric:        Mood and Affect: Mood normal.        Behavior: Behavior normal.     ED Results and Treatments Labs (all labs ordered are listed, but only abnormal results are displayed) Labs Reviewed  COMPREHENSIVE METABOLIC PANEL - Abnormal; Notable for the following components:      Result Value   Total Protein 6.2 (*)    AST 13 (*)    All other components within normal limits  CBC - Abnormal; Notable for the following components:   Hemoglobin 12.9 (*)    All other components within normal limits  LIPASE, BLOOD  URINALYSIS, ROUTINE W REFLEX MICROSCOPIC                                                                                                                          Radiology No results found.  Pertinent labs & imaging results that were available  during my care of the patient were reviewed by me and considered in my medical decision making (see MDM for details).  Medications Ordered in ED Medications - No data to display                                                                                                                                   Procedures Procedures  (including critical care time)  Medical Decision Making / ED Course   MDM:  41 year old male presenting to the emergency department with umbilical hernia.  Examination with soft, easily reducible umbilical hernia.  Suspect chronic umbilical hernia.  Differential also includes incarceration, obstruction, but given easily reducible, no tenderness or skin changes, able to tolerate p.o. at home, chronic symptoms for a year without significant change today, no evidence of these processes.  No indication for imaging.  He has no abdominal tenderness to suggest alternative intra-abdominal pathology such as appendicitis, cholecystitis, pancreatitis.  Will provide contact information for surgical  follow-up. Will discharge patient to home. All questions answered. Patient comfortable with plan of discharge. Return precautions discussed with patient and specified on the after visit summary.       Lab Tests: -I ordered, reviewed, and interpreted labs.   The pertinent results include:   Labs Reviewed  COMPREHENSIVE METABOLIC PANEL - Abnormal; Notable for the following components:      Result Value   Total Protein 6.2 (*)    AST 13 (*)    All other components within normal limits  CBC - Abnormal; Notable for the following components:   Hemoglobin 12.9 (*)    All other components within normal limits  LIPASE, BLOOD  URINALYSIS, ROUTINE W REFLEX MICROSCOPIC    Notable for very mild anemia, low AST  Social Determinants of Health:  Diagnosis or treatment significantly limited by social determinants of health: obesity   Co morbidities that complicate the patient  evaluation  Past Medical History:  Diagnosis Date   Umbilical hernia       Dispostion: Disposition decision including need for hospitalization was considered, and patient discharged from emergency department.    Final Clinical Impression(s) / ED Diagnoses Final diagnoses:  Umbilical hernia without obstruction and without gangrene     This chart was dictated using voice recognition software.  Despite best efforts to proofread,  errors can occur which can change the documentation meaning.    Cristie Hem, MD 07/08/22 1125

## 2022-07-18 ENCOUNTER — Ambulatory Visit: Payer: Self-pay | Admitting: Surgery

## 2022-08-08 ENCOUNTER — Other Ambulatory Visit: Payer: Self-pay

## 2022-08-08 ENCOUNTER — Encounter (HOSPITAL_BASED_OUTPATIENT_CLINIC_OR_DEPARTMENT_OTHER): Payer: Self-pay | Admitting: Surgery

## 2022-08-14 NOTE — Progress Notes (Signed)

## 2022-08-15 ENCOUNTER — Encounter (HOSPITAL_BASED_OUTPATIENT_CLINIC_OR_DEPARTMENT_OTHER)
Admission: RE | Disposition: A | Discharge status: Home / Self Care | Payer: Self-pay | Source: Home / Self Care | Attending: Surgery

## 2022-08-15 ENCOUNTER — Ambulatory Visit (HOSPITAL_BASED_OUTPATIENT_CLINIC_OR_DEPARTMENT_OTHER)
Admission: RE | Admit: 2022-08-15 | Discharge: 2022-08-15 | Disposition: A | Discharge status: Home / Self Care | Payer: No Typology Code available for payment source | Attending: Surgery | Admitting: Surgery

## 2022-08-15 ENCOUNTER — Ambulatory Visit (HOSPITAL_BASED_OUTPATIENT_CLINIC_OR_DEPARTMENT_OTHER): Payer: No Typology Code available for payment source | Admitting: Anesthesiology

## 2022-08-15 ENCOUNTER — Other Ambulatory Visit: Payer: Self-pay

## 2022-08-15 ENCOUNTER — Encounter (HOSPITAL_BASED_OUTPATIENT_CLINIC_OR_DEPARTMENT_OTHER): Payer: Self-pay | Admitting: Surgery

## 2022-08-15 DIAGNOSIS — K420 Umbilical hernia with obstruction, without gangrene: Secondary | ICD-10-CM | POA: Diagnosis present

## 2022-08-15 DIAGNOSIS — F17210 Nicotine dependence, cigarettes, uncomplicated: Secondary | ICD-10-CM | POA: Insufficient documentation

## 2022-08-15 HISTORY — PX: UMBILICAL HERNIA REPAIR: SHX196

## 2022-08-15 SURGERY — REPAIR, HERNIA, UMBILICAL, ADULT
Anesthesia: General | Site: Abdomen

## 2022-08-15 MED ORDER — BUPIVACAINE-EPINEPHRINE 0.25% -1:200000 IJ SOLN
INTRAMUSCULAR | Status: DC | PRN
  Administered 2022-08-15: 30 mL

## 2022-08-15 MED ORDER — DEXAMETHASONE SODIUM PHOSPHATE 4 MG/ML IJ SOLN
INTRAMUSCULAR | Status: DC | PRN
  Administered 2022-08-15: 4 mg via INTRAVENOUS

## 2022-08-15 MED ORDER — ACETAMINOPHEN 500 MG PO TABS
ORAL_TABLET | ORAL | Status: AC
  Filled 2022-08-15: qty 2

## 2022-08-15 MED ORDER — MIDAZOLAM HCL 2 MG/2ML IJ SOLN
INTRAMUSCULAR | Status: AC
  Filled 2022-08-15: qty 2

## 2022-08-15 MED ORDER — PROPOFOL 10 MG/ML IV BOLUS
INTRAVENOUS | Status: DC | PRN
  Administered 2022-08-15: 50 mg via INTRAVENOUS
  Administered 2022-08-15: 300 mg via INTRAVENOUS

## 2022-08-15 MED ORDER — HYDROMORPHONE HCL 1 MG/ML IJ SOLN
INTRAMUSCULAR | Status: AC
  Filled 2022-08-15: qty 0.5

## 2022-08-15 MED ORDER — MEPERIDINE HCL 25 MG/ML IJ SOLN
INTRAMUSCULAR | Status: AC
  Filled 2022-08-15: qty 1

## 2022-08-15 MED ORDER — OXYCODONE HCL 5 MG PO TABS: 5.0000 mg | ORAL_TABLET | Freq: Four times a day (QID) | ORAL | 0 refills | Status: AC | PRN

## 2022-08-15 MED ORDER — IBUPROFEN 800 MG PO TABS: 800.0000 mg | ORAL_TABLET | Freq: Three times a day (TID) | ORAL | 0 refills | Status: AC | PRN

## 2022-08-15 MED ORDER — OXYCODONE HCL 5 MG PO TABS
5.0000 mg | ORAL_TABLET | Freq: Once | ORAL | Status: AC | PRN
  Administered 2022-08-15: 5 mg via ORAL

## 2022-08-15 MED ORDER — ROCURONIUM BROMIDE 100 MG/10ML IV SOLN
INTRAVENOUS | Status: DC | PRN
  Administered 2022-08-15: 10 mg via INTRAVENOUS
  Administered 2022-08-15: 60 mg via INTRAVENOUS
  Administered 2022-08-15: 10 mg via INTRAVENOUS

## 2022-08-15 MED ORDER — CHLORHEXIDINE GLUCONATE CLOTH 2 % EX PADS: 6.0000 | MEDICATED_PAD | Freq: Once | CUTANEOUS | Status: DC

## 2022-08-15 MED ORDER — LIDOCAINE HCL (CARDIAC) PF 100 MG/5ML IV SOSY
PREFILLED_SYRINGE | INTRAVENOUS | Status: DC | PRN
  Administered 2022-08-15: 20 mg via INTRAVENOUS

## 2022-08-15 MED ORDER — GABAPENTIN 300 MG PO CAPS
ORAL_CAPSULE | ORAL | Status: AC
  Filled 2022-08-15: qty 1

## 2022-08-15 MED ORDER — OXYCODONE HCL 5 MG/5ML PO SOLN: 5.0000 mg | Freq: Once | ORAL | Status: AC | PRN

## 2022-08-15 MED ORDER — ONDANSETRON HCL 4 MG/2ML IJ SOLN
INTRAMUSCULAR | Status: AC
  Filled 2022-08-15: qty 2

## 2022-08-15 MED ORDER — PROMETHAZINE HCL 25 MG/ML IJ SOLN: 6.2500 mg | INTRAMUSCULAR | Status: DC | PRN

## 2022-08-15 MED ORDER — FENTANYL CITRATE (PF) 100 MCG/2ML IJ SOLN
INTRAMUSCULAR | Status: AC
  Filled 2022-08-15: qty 2

## 2022-08-15 MED ORDER — LACTATED RINGERS IV SOLN: INTRAVENOUS | Status: DC

## 2022-08-15 MED ORDER — MIDAZOLAM HCL 2 MG/2ML IJ SOLN: 0.5000 mg | Freq: Once | INTRAMUSCULAR | Status: DC | PRN

## 2022-08-15 MED ORDER — ACETAMINOPHEN 500 MG PO TABS
1000.0000 mg | ORAL_TABLET | Freq: Once | ORAL | Status: AC
  Administered 2022-08-15: 1000 mg via ORAL

## 2022-08-15 MED ORDER — OXYCODONE HCL 5 MG PO TABS
5.0000 mg | ORAL_TABLET | Freq: Once | ORAL | Status: AC
  Administered 2022-08-15: 5 mg via ORAL

## 2022-08-15 MED ORDER — FENTANYL CITRATE (PF) 100 MCG/2ML IJ SOLN
INTRAMUSCULAR | Status: DC | PRN
  Administered 2022-08-15 (×8): 50 ug via INTRAVENOUS

## 2022-08-15 MED ORDER — ACETAMINOPHEN 500 MG PO TABS: 1000.0000 mg | ORAL_TABLET | ORAL | Status: AC

## 2022-08-15 MED ORDER — 0.9 % SODIUM CHLORIDE (POUR BTL) OPTIME
TOPICAL | Status: DC | PRN
  Administered 2022-08-15: 150 mL

## 2022-08-15 MED ORDER — DEXMEDETOMIDINE HCL IN NACL 80 MCG/20ML IV SOLN
INTRAVENOUS | Status: DC | PRN
  Administered 2022-08-15 (×2): 8 ug via BUCCAL
  Administered 2022-08-15: 4 ug via BUCCAL
  Administered 2022-08-15: 8 ug via BUCCAL

## 2022-08-15 MED ORDER — OXYCODONE HCL 5 MG PO TABS
ORAL_TABLET | ORAL | Status: AC
  Filled 2022-08-15: qty 1

## 2022-08-15 MED ORDER — HYDROMORPHONE HCL 1 MG/ML IJ SOLN
0.2500 mg | INTRAMUSCULAR | Status: DC | PRN
  Administered 2022-08-15: 0.5 mg via INTRAVENOUS
  Administered 2022-08-15: 0.25 mg via INTRAVENOUS
  Administered 2022-08-15: 0.5 mg via INTRAVENOUS

## 2022-08-15 MED ORDER — ROCURONIUM BROMIDE 10 MG/ML (PF) SYRINGE
PREFILLED_SYRINGE | INTRAVENOUS | Status: AC
  Filled 2022-08-15: qty 10

## 2022-08-15 MED ORDER — CEFAZOLIN SODIUM-DEXTROSE 2-4 GM/100ML-% IV SOLN
2.0000 g | INTRAVENOUS | Status: AC
  Administered 2022-08-15: 2 g via INTRAVENOUS

## 2022-08-15 MED ORDER — SUGAMMADEX SODIUM 200 MG/2ML IV SOLN
INTRAVENOUS | Status: DC | PRN
  Administered 2022-08-15: 200 mg via INTRAVENOUS

## 2022-08-15 MED ORDER — GABAPENTIN 300 MG PO CAPS
300.0000 mg | ORAL_CAPSULE | ORAL | Status: AC
  Administered 2022-08-15: 300 mg via ORAL

## 2022-08-15 MED ORDER — ONDANSETRON HCL 4 MG/2ML IJ SOLN
INTRAMUSCULAR | Status: DC | PRN
  Administered 2022-08-15: 4 mg via INTRAVENOUS

## 2022-08-15 MED ORDER — MEPERIDINE HCL 25 MG/ML IJ SOLN
6.2500 mg | INTRAMUSCULAR | Status: DC | PRN
  Administered 2022-08-15 (×2): 6.25 mg via INTRAVENOUS

## 2022-08-15 MED ORDER — MIDAZOLAM HCL 5 MG/5ML IJ SOLN
INTRAMUSCULAR | Status: DC | PRN
  Administered 2022-08-15: 2 mg via INTRAVENOUS

## 2022-08-15 MED ORDER — CEFAZOLIN SODIUM-DEXTROSE 2-4 GM/100ML-% IV SOLN
INTRAVENOUS | Status: AC
  Filled 2022-08-15: qty 100

## 2022-08-15 MED ORDER — LIDOCAINE 2% (20 MG/ML) 5 ML SYRINGE
INTRAMUSCULAR | Status: AC
  Filled 2022-08-15: qty 5

## 2022-08-15 MED ORDER — PROPOFOL 10 MG/ML IV BOLUS
INTRAVENOUS | Status: AC
  Filled 2022-08-15: qty 20

## 2022-08-15 SURGICAL SUPPLY — 54 items
ADH SKN CLS APL DERMABOND .7 (GAUZE/BANDAGES/DRESSINGS) ×1
APL PRP STRL LF DISP 70% ISPRP (MISCELLANEOUS) ×1
APL SKNCLS STERI-STRIP NONHPOA (GAUZE/BANDAGES/DRESSINGS)
BENZOIN TINCTURE PRP APPL 2/3 (GAUZE/BANDAGES/DRESSINGS) IMPLANT
BLADE CLIPPER SURG (BLADE) IMPLANT
BLADE SURG 15 STRL LF DISP TIS (BLADE) ×1 IMPLANT
BLADE SURG 15 STRL SS (BLADE) ×1
CANISTER SUCT 1200ML W/VALVE (MISCELLANEOUS) IMPLANT
CHLORAPREP W/TINT 26 (MISCELLANEOUS) ×1 IMPLANT
COVER BACK TABLE 60X90IN (DRAPES) ×1 IMPLANT
COVER MAYO STAND STRL (DRAPES) ×1 IMPLANT
DERMABOND ADVANCED .7 DNX12 (GAUZE/BANDAGES/DRESSINGS) ×1 IMPLANT
DRAPE LAPAROTOMY TRNSV 102X78 (DRAPES) ×1 IMPLANT
DRAPE UTILITY XL STRL (DRAPES) ×1 IMPLANT
DRSG TEGADERM 4X4.75 (GAUZE/BANDAGES/DRESSINGS) IMPLANT
ELECT COATED BLADE 2.86 ST (ELECTRODE) ×1 IMPLANT
ELECT REM PT RETURN 9FT ADLT (ELECTROSURGICAL) ×1
ELECTRODE REM PT RTRN 9FT ADLT (ELECTROSURGICAL) ×1 IMPLANT
GLOVE BIO SURGEON STRL SZ 6.5 (GLOVE) IMPLANT
GLOVE BIOGEL PI IND STRL 6.5 (GLOVE) IMPLANT
GLOVE BIOGEL PI IND STRL 7.5 (GLOVE) IMPLANT
GLOVE BIOGEL PI IND STRL 8 (GLOVE) ×1 IMPLANT
GLOVE ECLIPSE 8.0 STRL XLNG CF (GLOVE) ×1 IMPLANT
GLOVE SURG SYN 7.5  E (GLOVE) ×1
GLOVE SURG SYN 7.5 E (GLOVE) ×1 IMPLANT
GLOVE SURG SYN 7.5 PF PI (GLOVE) IMPLANT
GOWN STRL REUS W/ TWL LRG LVL3 (GOWN DISPOSABLE) ×2 IMPLANT
GOWN STRL REUS W/ TWL XL LVL3 (GOWN DISPOSABLE) ×1 IMPLANT
GOWN STRL REUS W/TWL LRG LVL3 (GOWN DISPOSABLE) ×2
GOWN STRL REUS W/TWL XL LVL3 (GOWN DISPOSABLE) ×1
MESH VENTRALEX ST 2.5 CRC MED (Mesh General) IMPLANT
NDL HYPO 25X1 1.5 SAFETY (NEEDLE) ×1 IMPLANT
NEEDLE HYPO 22GX1.5 SAFETY (NEEDLE) IMPLANT
NEEDLE HYPO 25X1 1.5 SAFETY (NEEDLE) ×1 IMPLANT
NS IRRIG 1000ML POUR BTL (IV SOLUTION) IMPLANT
PACK BASIN DAY SURGERY FS (CUSTOM PROCEDURE TRAY) ×1 IMPLANT
PENCIL SMOKE EVACUATOR (MISCELLANEOUS) ×1 IMPLANT
SLEEVE SCD COMPRESS KNEE MED (STOCKING) ×1 IMPLANT
SPONGE T-LAP 4X18 ~~LOC~~+RFID (SPONGE) IMPLANT
STRIP CLOSURE SKIN 1/2X4 (GAUZE/BANDAGES/DRESSINGS) IMPLANT
SUT MON AB 4-0 PC3 18 (SUTURE) ×1 IMPLANT
SUT NOVA 0 T19/GS 22DT (SUTURE) IMPLANT
SUT NOVA NAB DX-16 0-1 5-0 T12 (SUTURE) IMPLANT
SUT NOVA NAB GS-21 1 T12 (SUTURE) IMPLANT
SUT SILK 3 0 SH 30 (SUTURE) IMPLANT
SUT VIC AB 2-0 SH 27 (SUTURE)
SUT VIC AB 2-0 SH 27XBRD (SUTURE) ×1 IMPLANT
SUT VICRYL 3-0 CR8 SH (SUTURE) ×1 IMPLANT
SUT VICRYL AB 2 0 TIE (SUTURE) IMPLANT
SUT VICRYL AB 2 0 TIES (SUTURE) ×2
SYR CONTROL 10ML LL (SYRINGE) ×1 IMPLANT
TOWEL GREEN STERILE FF (TOWEL DISPOSABLE) ×1 IMPLANT
TUBE CONNECTING 20X1/4 (TUBING) IMPLANT
YANKAUER SUCT BULB TIP NO VENT (SUCTIONS) IMPLANT

## 2022-08-15 NOTE — Op Note (Addendum)
Preoperative diagnosis: Chronically incarcerated umbilical hernia with omentum  Postoperative diagnosis: Same  Procedure: Repair of umbilical hernia with 6.2 cm circular Bard coated mesh with partial omental resection  Surgeon: Erroll Luna, MD  Assistant: Dr. Sheldon Silvan, MD      I was personally present during the key and critical portions of this procedure and immediately available throughout the entire procedure, as documented in my operative note.     Anesthesia: General with 0.25% Marcaine with epinephrine  EBL: 40 cc  Specimen: Part of the omentum sent to pathology  Drains: None  Indications for procedure: The patient is a 41 year old male with a chronically incarcerated umbilical hernia.  He presents for repair.  Risks and benefits of hernia surgery discussed.The risk of hernia repair include bleeding,  Infection,   Recurrence of the hernia,  Mesh use, chronic pain,  Organ injury,  Bowel injury,  Bladder injury,   nerve injury with numbness around the incision,  Death,  and worsening of preexisting  medical problems.  The alternatives to surgery have been discussed as well..  Long term expectations of both operative and non operative treatments have been discussed.   The patient agrees to proceed.      Description of procedure: The patient was met in the holding area and questions were answered.  He was taken back to the operative room and placed upon upon the OR table.  After induction of general anesthesia, the periumbilical region was prepped and draped in a sterile fashion and a timeout was performed.  Proper patient, site and procedure were verified and patient received appropriate preoperative antibiotics.  He had a large appearing chronically incarcerated local hernia.  A curvilinear incision was made along the base the umbilicus.  Dissection was carried down and a large hernia sac was found dense adherent to the undersurface of the skin of the umbilicus.  This was  dissected away sharply.  The sac was then opened.  There was a large amount of omentum in this.  The defect actually measured about 1 and half to 2 cm in maximal diameter.  I tried to reduce the omentum back through the small hole but was unsuccessful.  I placed my finger in the fascia and extended the fascial defect for another centimeter caudally.  He omentum still not reduced therefore I decided to resect part of the omentum to facilitate reduction.  Using Kelly clamps and 2-0 Vicryl ties is able to ligate part of the incarcerated omentum.  It was viable.  This enabled the rest the omentum to be reduced easily.  There was good hemostasis noted.  Once I did this the defect now is about 2-1/2 to 3 cm in maximal diameter.  I used a 6.2 cm circular Bard coated mesh.  Straps were used to help facilitate placement.  It was placed in a subfascial position.  This was pulled up slightly to the abdominal wall.  #1 Novafil sutures were used to help secure the mesh circumferentially in a subfascial position.  The straps that were used to hold up against the abdominal wall were divided.  I checked the mesh and the with the mesh.  I then secured the lateral corners of the mesh with sutures as well to have a total of 6 sutures circumferentially around the mesh securing it in the subfascial position.  We then closed the fascia over it once ensuring hemostasis with #1 Novafil.  There is some excessive of umbilical skin this was cut back.  We  then secured the skin of the umbilicus down to the fascia with 3-0 Vicryl.  This helped obliterate the dead space.  4 Monocryl was used to close the skin in a subcuticular fashion.  Dermabond was applied.  All counts were found to be correct.  Of note local anesthetic was infiltrated through around the incision for analgesic purposes.  All counts were correct.  The patient was then awoke extubated taken to recovery in satisfactory condition.

## 2022-08-15 NOTE — Anesthesia Postprocedure Evaluation (Signed)
Anesthesia Post Note  Patient: Ian Cox  Procedure(s) Performed: UMBILICAL HERNIA REPAIR WITH MESH (Abdomen)     Patient location during evaluation: PACU Anesthesia Type: General Level of consciousness: awake and alert, patient cooperative and oriented Pain management: pain level controlled Vital Signs Assessment: post-procedure vital signs reviewed and stable Respiratory status: spontaneous breathing, nonlabored ventilation and respiratory function stable Cardiovascular status: blood pressure returned to baseline and stable Postop Assessment: no apparent nausea or vomiting, able to ambulate and adequate PO intake Anesthetic complications: no   No notable events documented.  Last Vitals:  Vitals:   08/15/22 1400 08/15/22 1450  BP: (!) 146/88 (!) 158/94  Pulse: 60   Resp:    Temp: 37.2 C   SpO2: 100% 98%    Last Pain:  Vitals:   08/15/22 1459  TempSrc:   PainSc: 5                  Kimie Pidcock,E. Areeba Sulser

## 2022-08-15 NOTE — Anesthesia Procedure Notes (Signed)
Procedure Name: Intubation Date/Time: 08/15/2022 11:26 AM  Performed by: Maryella Shivers, CRNAPre-anesthesia Checklist: Patient identified, Emergency Drugs available, Suction available and Patient being monitored Patient Re-evaluated:Patient Re-evaluated prior to induction Oxygen Delivery Method: Circle system utilized Preoxygenation: Pre-oxygenation with 100% oxygen Induction Type: IV induction Ventilation: Mask ventilation without difficulty Laryngoscope Size: Mac and 4 Grade View: Grade I Tube type: Oral Tube size: 7.5 mm Number of attempts: 1 Airway Equipment and Method: Stylet and Oral airway Placement Confirmation: ETT inserted through vocal cords under direct vision, positive ETCO2 and breath sounds checked- equal and bilateral Secured at: 22 cm Tube secured with: Tape Dental Injury: Teeth and Oropharynx as per pre-operative assessment

## 2022-08-15 NOTE — Anesthesia Preprocedure Evaluation (Addendum)
Anesthesia Evaluation  Patient identified by MRN, date of birth, ID band Patient awake    Reviewed: Allergy & Precautions, NPO status , Patient's Chart, lab work & pertinent test results  History of Anesthesia Complications Negative for: history of anesthetic complications  Airway Mallampati: II  TM Distance: >3 FB     Dental  (+) Caps, Dental Advisory Given, Chipped   Pulmonary Current Smoker and Patient abstained from smoking.   breath sounds clear to auscultation       Cardiovascular negative cardio ROS  Rhythm:Regular Rate:Normal     Neuro/Psych negative neurological ROS     GI/Hepatic negative GI ROS,,,(+)     substance abuse (benzodiazepines)    Endo/Other  BMI 33  Renal/GU negative Renal ROS     Musculoskeletal   Abdominal  (+) + obese  Peds  Hematology   Anesthesia Other Findings   Reproductive/Obstetrics                             Anesthesia Physical Anesthesia Plan  ASA: 2  Anesthesia Plan: General   Post-op Pain Management: Tylenol PO (pre-op)*   Induction: Intravenous  PONV Risk Score and Plan: 1 and Ondansetron and Dexamethasone  Airway Management Planned: Oral ETT  Additional Equipment: None  Intra-op Plan:   Post-operative Plan: Extubation in OR  Informed Consent: I have reviewed the patients History and Physical, chart, labs and discussed the procedure including the risks, benefits and alternatives for the proposed anesthesia with the patient or authorized representative who has indicated his/her understanding and acceptance.     Dental advisory given  Plan Discussed with: CRNA and Surgeon  Anesthesia Plan Comments:         Anesthesia Quick Evaluation

## 2022-08-15 NOTE — H&P (Signed)
MRN: [id number] DOB: 1981-03-16 DATE OF ENCOUNTER: 07/18/2022 Subjective  Chief Complaint: New Consultation (Umb hernia)   History of Present Illness: Ian Cox is a 41 y.o. male who is seen today as an office consultation for evaluation of New Consultation (Umb hernia) .  Patient presents for evaluation of umbilical hernia. He has had it for many years. Is been causing more discomfort of the last 1 to 2 years. It is because pain Burman Blacksmith sought medical care in the past for. No nausea vomiting or change in bowel.  Review of Systems: A complete review of systems was obtained from the patient. I have reviewed this information and discussed as appropriate with the patient. See HPI as well for other ROS.    Medical History: No past medical history on file.  There is no problem list on file for this patient.  Past Surgical History: Procedure Laterality Date ENDOVENOUS ABLATION LEG VEIN Right 2011 ENDOVENOUS ABLATION LEG VEIN Right 04/08/2014 Procedure: R GSV EVLA ; Surgeon: Doretha Imus, MD; Location: [facility] OR; Service: General Surgery; Laterality: Right; ENDOVENOUS ABLATION LEG VEIN Right 05/06/2014 Procedure: R SSV EVLA; Surgeon: Doretha Imus, MD; Location: [facility] OR; Service: General Surgery; Laterality: Right;   No Known Allergies  No current outpatient medications on file prior to visit.  No current facility-administered medications on file prior to visit.  Family History Problem Relation Age of Onset Diabetes type II Mother No Known Problems Father Diabetes type II Maternal Aunt   Social History  Tobacco Use Smoking Status Every Day Packs/day: .5 Types: Cigarettes Smokeless Tobacco Never   Social History  Socioeconomic History Marital status: Single Tobacco Use Smoking status: Every Day Packs/day: .5 Types: Cigarettes Smokeless tobacco: Never Substance and Sexual Activity Alcohol use: No Alcohol/week: 0.0 standard drinks of alcohol Drug  use: Never  Objective:  Vitals: 07/18/22 1055 BP: 122/80 Pulse: 97 Temp: 36.8 C (98.3 F) SpO2: 98% Weight: (!) 108.4 kg (239 lb) Height: 180.3 cm (5\' 11" )  Body mass index is 33.33 kg/m.  Physical Exam HENT: Head: Normocephalic. Eyes: Pupils: Pupils are equal, round, and reactive to light. Cardiovascular: Rate and Rhythm: Normal rate. Abdominal: Palpations: Abdomen is soft. Tenderness: There is no abdominal tenderness. Hernia: A hernia is present. Hernia is present in the umbilical area.  Comments: Moderate size reducible umbilical hernia. Measuring 3 cm Musculoskeletal: Cervical back: Normal range of motion. Skin: General: Skin is warm. Neurological: General: No focal deficit present. Mental Status: He is alert. Psychiatric: Mood and Affect: Mood normal. Behavior: Behavior normal.    Assessment and Plan:  Diagnoses and all orders for this visit:  Umbilical hernia without obstruction or gangrene    Discussed repair of umbilical hernia with mesh. Risk and benefits of hernia repair reviewed. Nonoperative management strategies discussed. He wishes to proceed with repair of his umbilical hernia. Risk of bleeding, infection, recurrence, mesh use, chronic pain, organ injury, bowel injury, bowel obstruction, anesthesia risk, cardiovascular event, wound infection, and the need for other treatments and/or procedures.  No follow-ups on file.  , MD

## 2022-08-15 NOTE — Interval H&P Note (Signed)
History and Physical Interval Note:  08/15/2022 10:50 AM  Ian Cox  has presented today for surgery, with the diagnosis of UMBILICAL HERNIA.  The various methods of treatment have been discussed with the patient and family. After consideration of risks, benefits and other options for treatment, the patient has consented to  Procedure(s): UMBILICAL HERNIA REPAIR WITH MESH (N/A) as a surgical intervention.  The patient's history has been reviewed, patient examined, no change in status, stable for surgery.  I have reviewed the patient's chart and labs.  Questions were answered to the patient's satisfaction.   The risk of hernia repair include bleeding,  Infection,   Recurrence of the hernia,  Mesh use, chronic pain,  Organ injury,  Bowel injury,  Bladder injury,   nerve injury with numbness around the incision,  Death,  and worsening of preexisting  medical problems.  The alternatives to surgery have been discussed as well..  Long term expectations of both operative and non operative treatments have been discussed.   The patient agrees to proceed.   Dortha Schwalbe MD

## 2022-08-15 NOTE — Discharge Instructions (Addendum)
[facility] _______[facility]  UMBILICAL OR INGUINAL HERNIA REPAIR: POST OP INSTRUCTIONS  Always review your discharge instruction sheet given to you by the facility where your surgery was performed. IF YOU HAVE DISABILITY OR FAMILY LEAVE FORMS, YOU MUST BRING THEM TO THE OFFICE FOR PROCESSING.   DO NOT GIVE THEM TO YOUR DOCTOR.  1. A  prescription for pain medication may be given to you upon discharge.  Take your pain medication as prescribed, if needed.  If narcotic pain medicine is not needed, then you may take acetaminophen (Tylenol) or ibuprofen (Advil) as needed. 2. Take your usually prescribed medications unless otherwise directed. If you need a refill on your pain medication, please contact your pharmacy.  They will contact our office to request authorization. Prescriptions will not be filled after 5 pm or on week-ends. 3. You should follow a light diet the first 24 hours after arrival home, such as soup and crackers, etc.  Be sure to include lots of fluids daily.  Resume your normal diet the day after surgery. 4.Most patients will experience some swelling and bruising around the umbilicus or in the groin and scrotum.  Ice packs and reclining will help.  Swelling and bruising can take several days to resolve.  6. It is common to experience some constipation if taking pain medication after surgery.  Increasing fluid intake and taking a stool softener (such as Colace) will usually help or prevent this problem from occurring.  A mild laxative (Milk of Magnesia or Miralax) should be taken according to package directions if there are no bowel movements after 48 hours. 7. Unless discharge instructions indicate otherwise, you may remove your bandages 24-48 hours after surgery, and you may shower at that time.  You may have steri-strips (small skin tapes) in place directly over the incision.  These strips should be left on the skin for 7-10 days.  If your surgeon used skin glue on the  incision, you may shower in 24 hours.  The glue will flake off over the next 2-3 weeks.  Any sutures or staples will be removed at the office during your follow-up visit. 8. ACTIVITIES:  You may resume regular (light) daily activities beginning the next day--such as daily self-care, walking, climbing stairs--gradually increasing activities as tolerated.  You may have sexual intercourse when it is comfortable.  Refrain from any heavy lifting or straining until approved by your doctor.  a.You may drive when you are no longer taking prescription pain medication, you can comfortably wear a seatbelt, and you can safely maneuver your car and apply brakes. b.RETURN TO WORK:   _____________________________________________  9.You should see your doctor in the office for a follow-up appointment approximately 2-3 weeks after your surgery.  Make sure that you call for this appointment within a day or two after you arrive home to insure a convenient appointment time. 10.OTHER INSTRUCTIONS: _________________________    _____________________________________  WHEN TO CALL YOUR DOCTOR: Fever over 101.0 Inability to urinate Nausea and/or vomiting Extreme swelling or bruising Continued bleeding from incision. Increased pain, redness, or drainage from the incision  The clinic staff is available to answer your questions during regular business hours.  Please don't hesitate to call and ask to speak to one of the nurses for clinical concerns.  If you have a medical emergency, go to the nearest emergency room or call 911.  A surgeon from [facility] is always on call at the hospital   [address], [address],   [place], [place]  [address] ?  [address] [address], [place], [place]   [address] [phone] ? [phone] ? FAX [phone] Web site: [email or web address]    Post Anesthesia Home Care Instructions  Activity: Get plenty of rest for the remainder of the day. A responsible  individual must stay with you for 24 hours following the procedure.  For the next 24 hours, DO NOT: -Drive a car -Advertising copywriter -Drink alcoholic beverages -Take any medication unless instructed by your physician -Make any legal decisions or sign important papers.  Meals: Start with liquid foods such as gelatin or soup. Progress to regular foods as tolerated. Avoid greasy, spicy, heavy foods. If nausea and/or vomiting occur, drink only clear liquids until the nausea and/or vomiting subsides. Call your physician if vomiting continues.  Special Instructions/Symptoms: Your throat may feel dry or sore from the anesthesia or the breathing tube placed in your throat during surgery. If this causes discomfort, gargle with warm salt water. The discomfort should disappear within 24 hours.  If you had a scopolamine patch placed behind your ear for the management of post- operative nausea and/or vomiting:  1. The medication in the patch is effective for 72 hours, after which it should be removed.  Wrap patch in a tissue and discard in the trash. Wash hands thoroughly with soap and water. 2. You may remove the patch earlier than 72 hours if you experience unpleasant side effects which may include dry mouth, dizziness or visual disturbances. 3. Avoid touching the patch. Wash your hands with soap and water after contact with the patch.    May take Tylenol after 3:30pm if needed.

## 2022-08-15 NOTE — Transfer of Care (Signed)
Immediate Anesthesia Transfer of Care Note  Patient: Ian Cox  Procedure(s) Performed: UMBILICAL HERNIA REPAIR WITH MESH (Abdomen)  Patient Location: PACU  Anesthesia Type:General  Level of Consciousness: awake, alert , and oriented  Airway & Oxygen Therapy: Patient Spontanous Breathing and Patient connected to face mask oxygen  Post-op Assessment: Report given to RN and Post -op Vital signs reviewed and stable  Post vital signs: Reviewed and stable  Last Vitals:  Vitals Value Taken Time  BP    Temp    Pulse 62 08/15/22 1307  Resp 16 08/15/22 1307  SpO2 100 % 08/15/22 1307  Vitals shown include unvalidated device data.  Last Pain:  Vitals:   08/15/22 0930  TempSrc: Oral  PainSc: 0-No pain         Complications: No notable events documented.

## 2022-08-16 ENCOUNTER — Encounter (HOSPITAL_BASED_OUTPATIENT_CLINIC_OR_DEPARTMENT_OTHER): Payer: Self-pay | Admitting: Surgery

## 2022-08-16 LAB — SURGICAL PATHOLOGY

## 2022-09-19 ENCOUNTER — Other Ambulatory Visit: Payer: Self-pay | Admitting: *Deleted

## 2022-09-19 DIAGNOSIS — M79606 Pain in leg, unspecified: Secondary | ICD-10-CM

## 2022-09-25 ENCOUNTER — Ambulatory Visit (INDEPENDENT_AMBULATORY_CARE_PROVIDER_SITE_OTHER)
Admission: RE | Admit: 2022-09-25 | Discharge: 2022-09-25 | Disposition: A | Discharge status: Home / Self Care | Payer: No Typology Code available for payment source | Source: Ambulatory Visit | Attending: Surgery | Admitting: Surgery

## 2022-09-25 ENCOUNTER — Encounter: Payer: Self-pay | Admitting: Surgery

## 2022-09-25 ENCOUNTER — Ambulatory Visit (HOSPITAL_COMMUNITY)
Admission: RE | Admit: 2022-09-25 | Discharge: 2022-09-25 | Disposition: A | Discharge status: Home / Self Care | Payer: No Typology Code available for payment source | Source: Ambulatory Visit | Attending: Surgery | Admitting: Surgery

## 2022-09-25 ENCOUNTER — Ambulatory Visit (INDEPENDENT_AMBULATORY_CARE_PROVIDER_SITE_OTHER): Payer: No Typology Code available for payment source | Admitting: Surgery

## 2022-09-25 ENCOUNTER — Other Ambulatory Visit (HOSPITAL_COMMUNITY): Payer: Self-pay | Admitting: Surgery

## 2022-09-25 VITALS — BP 143/85 | HR 75 | Temp 98.2°F | Resp 20 | Ht 71.0 in | Wt 234.0 lb

## 2022-09-25 DIAGNOSIS — I872 Venous insufficiency (chronic) (peripheral): Secondary | ICD-10-CM

## 2022-09-25 DIAGNOSIS — M79606 Pain in leg, unspecified: Secondary | ICD-10-CM | POA: Diagnosis present

## 2022-09-25 DIAGNOSIS — I83893 Varicose veins of bilateral lower extremities with other complications: Secondary | ICD-10-CM

## 2022-09-25 LAB — VAS US ABI WITH/WO TBI
Left ABI: 1.33
Right ABI: 1.46

## 2022-09-25 NOTE — Progress Notes (Signed)
[facility]  Patient name: Ian Cox MRN: [id number] DOB: 06-21-1981 Sex: male   REQUESTING PROVIDER:    Dr. Brantley Stage   REASON FOR CONSULT:    Venous insufficiency with ulceration  HISTORY OF PRESENT ILLNESS:   Ian Cox is a 42 y.o. male, who is referred for evaluation of leg swelling and ulceration.  Patient does have a history of venous disease and has undergone saphenous ablation at [facility] approximately 12 years ago.  He has constant leg swelling.  He has discomfort when he stands.  His discomfort wakes him up at night.  He has had an ulcer on the lateral side of his right leg for approximately 2 years.  He has occasionally worn compression socks.  He does have skin discoloration on both legs.  PAST MEDICAL HISTORY    Past Medical History:  Diagnosis Date   Peripheral vascular disease ([place])    Umbilical hernia      FAMILY HISTORY   History reviewed. No pertinent family history.  SOCIAL HISTORY:   Social History   Socioeconomic History   Marital status: Married    Spouse name: Not on file   Number of children: Not on file   Years of education: Not on file   Highest education level: Not on file  Occupational History   Not on file  Tobacco Use   Smoking status: Every Day    Packs/day: 0.50    Types: Cigarettes   Smokeless tobacco: Never  Vaping Use   Vaping Use: Never used  Substance and Sexual Activity   Alcohol use: Never   Drug use: Yes    Comment: xanex pills bought on street over a month ago   Sexual activity: Not on file  Other Topics Concern   Not on file  Social History Narrative   Not on file   Social Determinants of Health   Financial Resource Strain: Not on file  Food Insecurity: Not on file  Transportation Needs: Not on file  Physical Activity: Not on file  Stress: Not on file  Social Connections: Not on file  Intimate Partner Violence: Not on file     ALLERGIES:    No Known Allergies  CURRENT MEDICATIONS:    Current Outpatient Medications  Medication Sig Dispense Refill   acetaminophen (TYLENOL) 500 MG tablet Take 500-1,000 mg by mouth every 6 (six) hours as needed for mild pain or headache.     ibuprofen (ADVIL) 800 MG tablet Take 1 tablet (800 mg total) by mouth every 8 (eight) hours as needed. 30 tablet 0   oxyCODONE (OXY IR/ROXICODONE) 5 MG immediate release tablet Take 1 tablet (5 mg total) by mouth every 6 (six) hours as needed for severe pain. 15 tablet 0   naloxone (NARCAN) nasal spray 4 mg/0.1 mL Use per directions on box for unconsciousness, stopping breathing after drug use. (Patient not taking: Reported on 09/25/2022) 2 each 1   No current facility-administered medications for this visit.    REVIEW OF SYSTEMS:   [X]  denotes positive finding, [ ]  denotes negative finding Cardiac  Comments:  Chest pain or chest pressure:    Shortness of breath upon exertion:    Short of breath when lying flat:    Irregular heart rhythm:        Vascular    Pain in calf, thigh, or hip brought on by ambulation:    Pain in feet at night that wakes you up from your sleep:  Blood clot in your veins:    Leg swelling:  x       Pulmonary    Oxygen at home:    Productive cough:     Wheezing:         Neurologic    Sudden weakness in arms or legs:     Sudden numbness in arms or legs:     Sudden onset of difficulty speaking or slurred speech:    Temporary loss of vision in one eye:     Problems with dizziness:         Gastrointestinal    Blood in stool:      Vomited blood:         Genitourinary    Burning when urinating:     Blood in urine:        Psychiatric    Major depression:         Hematologic    Bleeding problems:    Problems with blood clotting too easily:        Skin    Rashes or ulcers:        Constitutional    Fever or chills:     PHYSICAL EXAM:   Vitals:   09/25/22 0938  BP: (!) 143/85   Pulse: 75  Resp: 20  Temp: 98.2 F (36.8 C)  SpO2: 96%  Weight: 234 lb (106.1 kg)  Height: 5\' 11"  (1.803 m)    GENERAL: The patient is a well-nourished male, in no acute distress. The vital signs are documented above. CARDIAC: There is a regular rate and rhythm.  VASCULAR: I evaluated the saphenous vein on the right leg with SonoSite.  It was not visible, consistent with prior ablation.  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  I was not able to correctly identify the small saphenous vein however this was probably more from a positioning standpoint.  There are multiple large varicosities in the calf PULMONARY: Nonlabored respirations ABDOMEN: Soft and non-tender with normal pitched bowel sounds.  MUSCULOSKELETAL: There are no major deformities or cyanosis. NEUROLOGIC: No focal weakness or paresthesias are detected. SKIN: There are no ulcers or rashes noted. PSYCHIATRIC: The patient has a normal affect.  STUDIES:   I have reviewed the following vascular studies: ABI/TBIToday's ABIToday's TBIPrevious ABIPrevious TBI  +-------+-----------+-----------+------------+------------+  Right 1.46       1.23                                 +-------+-----------+-----------+------------+------------+  Left  1.33       0.56                                 +-------+-----------+-----------+------------+------------+  Right toe pressure: 175 Left toe pressure: 80 All waveforms are triphasic Venous Reflux Times  +--------------+--------+------+----------+------------+-------------------  ----+  RIGHT        Reflux  Reflux  Reflux  Diameter cmsComments                                No       Yes     Time                                         +--------------+--------+------+----------+------------+-------------------  ----+  CFV          no                                  Enlarged lymph  node       +--------------+--------+------+----------+------------+-------------------  ----+  FV mid                 yes  >1 second                                       +--------------+--------+------+----------+------------+-------------------  ----+  Popliteal             yes  >1 second                                       +--------------+--------+------+----------+------------+-------------------  ----+  GSV at SFJ             yes   >500 ms      0.71                              +--------------+--------+------+----------+------------+-------------------  ----+  GSV prox thigh         yes   >500 ms      0.76    vv                        +--------------+--------+------+----------+------------+-------------------  ----+  GSV mid thigh                                     Too small                 +--------------+--------+------+----------+------------+-------------------  ----+  GSV dist thighno                          0.38                              +--------------+--------+------+----------+------------+-------------------  ----+  GSV at knee   no                          .38                               +--------------+--------+------+----------+------------+-------------------  ----+  GSV prox calf          yes   >500 ms      0.39    0.44 perforator                                                             w/reflux                  +--------------+--------+------+----------+------------+-------------------  ----+  GSV mid calf  NV                        +--------------+--------+------+----------+------------+-------------------  ----+  SSV Pop Fossa          yes   >500 ms      1.09                              +--------------+--------+------+----------+------------+-------------------  ----+  SSV prox calf no                          0.31    chronic  thrombus          +--------------+--------+------+----------+------------+-------------------  ----+  SSV mid calf           yes   >500 ms      0.67    chronic thrombus          +--------------+--------+------+----------+------------+-------------------  ----+  AASV O                 yes   >500 ms      0.47                              +--------------+--------+------+----------+------------+-------------------  ----+  AASV p                 yes   >500 ms      0.55                              +--------------+--------+------+----------+------------+-------------------  ----+  AASV mid               yes   >500 ms                                        +--------------+--------+------+----------+------------+-------------------  ----+      ASSESSMENT and PLAN   CEAP class 6: Ultrasound today shows that the great saphenous vein is closed and that there is clot within the small saphenous.  He does have reflux and an anterior accessory branch.  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  I am placing the patient into 20-30 compression socks to see how this helps his symptoms.  He will return in 3 months.  At that time I will consider evaluating him for ablation of the anterior accessory vein as well as stab phlebectomies in the calf   Annamarie Major, IV, MD, FACS [facility] [phone] Pager [phone]

## 2022-09-27 ENCOUNTER — Other Ambulatory Visit: Payer: Self-pay

## 2022-09-27 DIAGNOSIS — I83893 Varicose veins of bilateral lower extremities with other complications: Secondary | ICD-10-CM

## 2022-09-27 DIAGNOSIS — M79606 Pain in leg, unspecified: Secondary | ICD-10-CM

## 2023-01-01 ENCOUNTER — Ambulatory Visit: Payer: Medicaid Other | Admitting: Surgery

## 2023-01-01 ENCOUNTER — Ambulatory Visit (HOSPITAL_COMMUNITY): Payer: Medicaid Other | Attending: Surgery

## 2023-01-01 NOTE — Progress Notes (Deleted)
Vascular and Vein Specialist of [facility]  Patient name: Ian Cox MRN: [id number] DOB: 1980-09-23 Sex: male   REASON FOR VISIT:    Follow up varicose vein  HISOTRY OF PRESENT ILLNESS:    Ian Cox is a 42 y.o. male who I saw in January 2024 for evaluation of leg swelling and ulceration.  The patient has a history of venous reflux and has undergone saphenous ablation at [facility] approximately 12 years ago.  He has had persistent leg swelling.  He has discomfort when he stands.  This also wakes him up at night.  He had an ulcer on the lateral aspect of his right leg for approximately 2 years.  He will occasionally wear compression socks.  He does have discoloration on both legs.  He had a venous reflux study performed that shows reflux in the anterior accessory branch as well as numerous varicosities in the calf.  His great saphenous vein was closed.  I placed him in 20-30 compression socks.  He is back today for follow-up.   PAST MEDICAL HISTORY:   Past Medical History:  Diagnosis Date   Peripheral vascular disease (HCC)    Umbilical hernia      FAMILY HISTORY:   No family history on file.  SOCIAL HISTORY:   Social History   Tobacco Use   Smoking status: Every Day    Packs/day: .5    Types: Cigarettes   Smokeless tobacco: Never  Substance Use Topics   Alcohol use: Never     ALLERGIES:   No Known Allergies   CURRENT MEDICATIONS:   Current Outpatient Medications  Medication Sig Dispense Refill   acetaminophen (TYLENOL) 500 MG tablet Take 500-1,000 mg by mouth every 6 (six) hours as needed for mild pain or headache.     ibuprofen (ADVIL) 800 MG tablet Take 1 tablet (800 mg total) by mouth every 8 (eight) hours as needed. 30 tablet 0   naloxone (NARCAN) nasal spray 4 mg/0.1 mL Use per directions on box for unconsciousness, stopping breathing after drug use. (Patient not taking: Reported on 09/25/2022) 2 each 1   oxyCODONE  (OXY IR/ROXICODONE) 5 MG immediate release tablet Take 1 tablet (5 mg total) by mouth every 6 (six) hours as needed for severe pain. 15 tablet 0   No current facility-administered medications for this visit.    REVIEW OF SYSTEMS:   [X]  denotes positive finding, [ ]  denotes negative finding Cardiac  Comments:  Chest pain or chest pressure: ***   Shortness of breath upon exertion:    Short of breath when lying flat:    Irregular heart rhythm:        Vascular    Pain in calf, thigh, or hip brought on by ambulation:    Pain in feet at night that wakes you up from your sleep:     Blood clot in your veins:    Leg swelling:         Pulmonary    Oxygen at home:    Productive cough:     Wheezing:         Neurologic    Sudden weakness in arms or legs:     Sudden numbness in arms or legs:     Sudden onset of difficulty speaking or slurred speech:    Temporary loss of vision in one eye:     Problems with dizziness:         Gastrointestinal    Blood in stool:  Vomited blood:         Genitourinary    Burning when urinating:     Blood in urine:        Psychiatric    Major depression:         Hematologic    Bleeding problems:    Problems with blood clotting too easily:        Skin    Rashes or ulcers:        Constitutional    Fever or chills:      PHYSICAL EXAM:   There were no vitals filed for this visit.  GENERAL: The patient is a well-nourished male, in no acute distress. The vital signs are documented above. CARDIAC: There is a regular rate and rhythm.  VASCULAR: *** PULMONARY: Non-labored respirations ABDOMEN: Soft and non-tender with normal pitched bowel sounds.  MUSCULOSKELETAL: There are no major deformities or cyanosis. NEUROLOGIC: No focal weakness or paresthesias are detected. SKIN: There are no ulcers or rashes noted. PSYCHIATRIC: The patient has a normal affect.  STUDIES:   ***  MEDICAL ISSUES:   ***    Charlena Cross, MD,  FACS [facility] [phone] Pager [phone]

## 2023-06-26 ENCOUNTER — Emergency Department (HOSPITAL_COMMUNITY)
Admission: EM | Admit: 2023-06-26 | Discharge: 2023-06-26 | Attending: Emergency Medicine | Admitting: Emergency Medicine

## 2023-06-26 ENCOUNTER — Emergency Department (HOSPITAL_COMMUNITY)

## 2023-06-26 ENCOUNTER — Other Ambulatory Visit: Payer: Self-pay

## 2023-06-26 DIAGNOSIS — L97311 Non-pressure chronic ulcer of right ankle limited to breakdown of skin: Secondary | ICD-10-CM | POA: Diagnosis not present

## 2023-06-26 DIAGNOSIS — M79661 Pain in right lower leg: Secondary | ICD-10-CM | POA: Diagnosis present

## 2023-06-26 DIAGNOSIS — L03115 Cellulitis of right lower limb: Secondary | ICD-10-CM | POA: Diagnosis not present

## 2023-06-26 LAB — CBC WITH DIFFERENTIAL/PLATELET
Abs Immature Granulocytes: 0.01 10*3/uL (ref 0.00–0.07)
Basophils Absolute: 0.1 10*3/uL (ref 0.0–0.1)
Basophils Relative: 1 %
Eosinophils Absolute: 0.5 10*3/uL (ref 0.0–0.5)
Eosinophils Relative: 6 %
HCT: 36.2 % — ABNORMAL LOW (ref 39.0–52.0)
Hemoglobin: 11.5 g/dL — ABNORMAL LOW (ref 13.0–17.0)
Immature Granulocytes: 0 %
Lymphocytes Relative: 33 %
Lymphs Abs: 2.7 10*3/uL (ref 0.7–4.0)
MCH: 26.5 pg (ref 26.0–34.0)
MCHC: 31.8 g/dL (ref 30.0–36.0)
MCV: 83.4 fL (ref 80.0–100.0)
Monocytes Absolute: 0.8 10*3/uL (ref 0.1–1.0)
Monocytes Relative: 10 %
Neutro Abs: 4.1 10*3/uL (ref 1.7–7.7)
Neutrophils Relative %: 50 %
Platelets: 247 10*3/uL (ref 150–400)
RBC: 4.34 MIL/uL (ref 4.22–5.81)
RDW: 13.3 % (ref 11.5–15.5)
WBC: 8.1 10*3/uL (ref 4.0–10.5)
nRBC: 0 % (ref 0.0–0.2)

## 2023-06-26 LAB — COMPREHENSIVE METABOLIC PANEL
ALT: 14 U/L (ref 0–44)
AST: 18 U/L (ref 15–41)
Albumin: 3.5 g/dL (ref 3.5–5.0)
Alkaline Phosphatase: 62 U/L (ref 38–126)
Anion gap: 6 (ref 5–15)
BUN: 9 mg/dL (ref 6–20)
CO2: 29 mmol/L (ref 22–32)
Calcium: 8.7 mg/dL — ABNORMAL LOW (ref 8.9–10.3)
Chloride: 104 mmol/L (ref 98–111)
Creatinine, Ser: 0.85 mg/dL (ref 0.61–1.24)
GFR, Estimated: >60 mL/min (ref >60)
Glucose, Bld: 88 mg/dL (ref 70–99)
Potassium: 3.9 mmol/L (ref 3.5–5.1)
Sodium: 139 mmol/L (ref 135–145)
Total Bilirubin: 0.6 mg/dL (ref 0.3–1.2)
Total Protein: 6.8 g/dL (ref 6.5–8.1)

## 2023-06-26 MED ORDER — CLINDAMYCIN HCL 300 MG PO CAPS: 300.0000 mg | ORAL_CAPSULE | Freq: Three times a day (TID) | ORAL | 0 refills | Status: AC

## 2023-06-26 MED ORDER — CLINDAMYCIN PHOSPHATE 600 MG/50ML IV SOLN
600.0000 mg | Freq: Once | INTRAVENOUS | Status: AC
  Administered 2023-06-26: 600 mg via INTRAVENOUS
  Filled 2023-06-26: qty 50

## 2023-06-26 NOTE — Discharge Instructions (Signed)
As we discussed your lab work and x-rays were unremarkable today.  I have prescribed clindamycin 300 mg 3 times a day for 7 days for the ankle cellulitis  Follow-up with your doctor  Return to ER if you have worse ankle pain or purulent discharge or fevers

## 2023-06-26 NOTE — ED Triage Notes (Signed)
Pt BIB [facility]. Pt c/o right leg wound infection. Pt reports right leg swelling that started two weeks ago. Pt c/o burning/throbbing pain. Pt aox4

## 2023-06-26 NOTE — ED Provider Notes (Signed)
[place] EMERGENCY DEPARTMENT AT [facility] Provider Note   CSN: [id number] Arrival date & time: 06/26/23  1957     History  Chief Complaint  Patient presents with   Wound Infection    Ian Cox is a 42 y.o. male history of varicose veins, here presenting with right lower extremity pain.  Patient has been in jail for the last 6 months.  Patient states that for the last week or so, he noticed a wound in the lateral aspect of the right ankle.  He states that there is some discharge from the wound now.  Patient trauma or injury.  Denies any fevers.  Patient has not been on any antibiotics recently.  The history is provided by the patient.       Home Medications Prior to Admission medications   Medication Sig Start Date End Date Taking? Authorizing Provider  acetaminophen (TYLENOL) 500 MG tablet Take 500-1,000 mg by mouth every 6 (six) hours as needed for mild pain or headache.    [provider]  ibuprofen (ADVIL) 800 MG tablet Take 1 tablet (800 mg total) by mouth every 8 (eight) hours as needed. 08/15/22   Cornett, Maisie Fus, MD  naloxone [facility]) nasal spray 4 mg/0.1 mL Use per directions on box for unconsciousness, stopping breathing after drug use. Patient not taking: Reported on 09/25/2022 05/02/22   Mesner, Barbara Cower, MD  oxyCODONE (OXY IR/ROXICODONE) 5 MG immediate release tablet Take 1 tablet (5 mg total) by mouth every 6 (six) hours as needed for severe pain. 08/15/22   Harriette Bouillon, MD      Allergies    Patient has no known allergies.    Review of Systems   Review of Systems  Skin:  Positive for wound.  All other systems reviewed and are negative.   Physical Exam Updated Vital Signs BP 135/87   Pulse 74   Temp 98.4 F (36.9 C)   Resp 17   SpO2 97%  Physical Exam Vitals and nursing note reviewed.  Constitutional:      Appearance: Normal appearance.  HENT:     Head: Normocephalic.     Nose: Nose normal.     Mouth/Throat:      Mouth: Mucous membranes are moist.  Eyes:     Extraocular Movements: Extraocular movements intact.     Pupils: Pupils are equal, round, and reactive to light.  Cardiovascular:     Rate and Rhythm: Normal rate.     Pulses: Normal pulses.  Pulmonary:     Effort: Pulmonary effort is normal.  Abdominal:     General: Abdomen is flat.  Musculoskeletal:        General: Normal range of motion.     Cervical back: Normal range of motion and neck supple.  Skin:    Comments: Patient has a ulcer on the lateral aspect of the right ankle.  There is minimal discharge.  Patient has no obvious subcutaneous air on exam.  Patient has 2+ DP pulse.  Neurological:     General: No focal deficit present.     Mental Status: He is alert and oriented to person, place, and time.  Psychiatric:        Mood and Affect: Mood normal.        Behavior: Behavior normal.     ED Results / Procedures / Treatments   Labs (all labs ordered are listed, but only abnormal results are displayed) Labs Reviewed  CBC WITH DIFFERENTIAL/PLATELET - Abnormal; Notable for the  following components:      Result Value   Hemoglobin 11.5 (*)    HCT 36.2 (*)    All other components within normal limits  COMPREHENSIVE METABOLIC PANEL    EKG None  Radiology No results found.  Procedures Procedures    Medications Ordered in ED Medications  clindamycin (CLEOCIN) IVPB 600 mg (600 mg Intravenous New Bag/Given 06/26/23 2225)    ED Course/ Medical Decision Making/ A&P                                 Medical Decision Making Ian Cox is a 42 y.o. male here presenting with right ankle ulcer with surrounding cellulitis.  Patient has been in jail for last 6 months.  I think likely cellulitis but will get x-ray to rule out osteomyelitis.  Will get CBC and CMP as well.  Will give clindamycin empirically  11:24 PM I reviewed patient's labs and independently interpreted x-ray.  White blood cell count is normal.  X-ray did  not show any osteomyelitis or free air.  Will discharge back to jail with a course of clindamycin.  Problems Addressed: Cellulitis of right lower extremity: acute illness or injury Skin ulcer of right ankle, limited to breakdown of skin [facility]): acute illness or injury  Amount and/or Complexity of Data Reviewed Labs: ordered. Decision-making details documented in ED Course. Radiology: ordered and independent interpretation performed. Decision-making details documented in ED Course.  Risk Prescription drug management.    Final Clinical Impression(s) / ED Diagnoses Final diagnoses:  None    Rx / DC Orders ED Discharge Orders     None         Charlynne Pander, MD 06/26/23 2325

## 2023-11-20 ENCOUNTER — Other Ambulatory Visit: Payer: Self-pay | Admitting: Urology

## 2023-11-20 DIAGNOSIS — R310 Gross hematuria: Secondary | ICD-10-CM

## 2024-01-01 ENCOUNTER — Ambulatory Visit
Admission: RE | Admit: 2024-01-01 | Discharge: 2024-01-01 | Disposition: A | Discharge status: Home / Self Care | Source: Ambulatory Visit | Attending: Urology | Admitting: Urology

## 2024-01-01 DIAGNOSIS — R310 Gross hematuria: Secondary | ICD-10-CM

## 2024-01-01 MED ORDER — IOPAMIDOL (ISOVUE-370) INJECTION 76%
500.0000 mL | Freq: Once | INTRAVENOUS | Status: AC | PRN
  Administered 2024-01-01: 130 mL via INTRAVENOUS
